# Patient Record
Sex: Male | Born: 1945 | Race: White | Hispanic: No | Marital: Married | Smoking: Former smoker
Health system: Southern US, Community
[De-identification: ages and names within clinical notes are randomized; demographics above are authoritative.]

## PROBLEM LIST (undated history)

## (undated) HISTORY — PX: TONSILLECTOMY: SUR1361

---

## 2013-10-02 ENCOUNTER — Ambulatory Visit: Payer: Self-pay | Admitting: Gastroenterology

## 2013-10-03 LAB — PATHOLOGY REPORT

## 2015-07-26 DIAGNOSIS — R7309 Other abnormal glucose: Secondary | ICD-10-CM | POA: Diagnosis not present

## 2015-07-26 DIAGNOSIS — E7800 Pure hypercholesterolemia, unspecified: Secondary | ICD-10-CM | POA: Diagnosis not present

## 2015-07-26 DIAGNOSIS — Z131 Encounter for screening for diabetes mellitus: Secondary | ICD-10-CM | POA: Diagnosis not present

## 2015-07-26 DIAGNOSIS — Z125 Encounter for screening for malignant neoplasm of prostate: Secondary | ICD-10-CM | POA: Diagnosis not present

## 2015-08-02 DIAGNOSIS — E7800 Pure hypercholesterolemia, unspecified: Secondary | ICD-10-CM | POA: Diagnosis not present

## 2015-08-02 DIAGNOSIS — Z131 Encounter for screening for diabetes mellitus: Secondary | ICD-10-CM | POA: Diagnosis not present

## 2015-08-02 DIAGNOSIS — Z0000 Encounter for general adult medical examination without abnormal findings: Secondary | ICD-10-CM | POA: Diagnosis not present

## 2015-08-02 DIAGNOSIS — M79672 Pain in left foot: Secondary | ICD-10-CM | POA: Diagnosis not present

## 2015-08-30 DIAGNOSIS — Z85820 Personal history of malignant melanoma of skin: Secondary | ICD-10-CM | POA: Diagnosis not present

## 2015-08-30 DIAGNOSIS — L218 Other seborrheic dermatitis: Secondary | ICD-10-CM | POA: Diagnosis not present

## 2015-08-30 DIAGNOSIS — L853 Xerosis cutis: Secondary | ICD-10-CM | POA: Diagnosis not present

## 2015-08-30 DIAGNOSIS — L821 Other seborrheic keratosis: Secondary | ICD-10-CM | POA: Diagnosis not present

## 2016-01-03 DIAGNOSIS — L298 Other pruritus: Secondary | ICD-10-CM | POA: Diagnosis not present

## 2016-01-03 DIAGNOSIS — L218 Other seborrheic dermatitis: Secondary | ICD-10-CM | POA: Diagnosis not present

## 2016-01-03 DIAGNOSIS — L821 Other seborrheic keratosis: Secondary | ICD-10-CM | POA: Diagnosis not present

## 2016-01-03 DIAGNOSIS — Z85820 Personal history of malignant melanoma of skin: Secondary | ICD-10-CM | POA: Diagnosis not present

## 2016-01-03 DIAGNOSIS — L300 Nummular dermatitis: Secondary | ICD-10-CM | POA: Diagnosis not present

## 2016-01-03 DIAGNOSIS — L820 Inflamed seborrheic keratosis: Secondary | ICD-10-CM | POA: Diagnosis not present

## 2016-01-03 DIAGNOSIS — L538 Other specified erythematous conditions: Secondary | ICD-10-CM | POA: Diagnosis not present

## 2016-02-01 DIAGNOSIS — M546 Pain in thoracic spine: Secondary | ICD-10-CM | POA: Diagnosis not present

## 2016-05-08 DIAGNOSIS — Z85820 Personal history of malignant melanoma of skin: Secondary | ICD-10-CM | POA: Diagnosis not present

## 2016-05-08 DIAGNOSIS — D2261 Melanocytic nevi of right upper limb, including shoulder: Secondary | ICD-10-CM | POA: Diagnosis not present

## 2016-05-08 DIAGNOSIS — D225 Melanocytic nevi of trunk: Secondary | ICD-10-CM | POA: Diagnosis not present

## 2016-05-08 DIAGNOSIS — L570 Actinic keratosis: Secondary | ICD-10-CM | POA: Diagnosis not present

## 2016-05-08 DIAGNOSIS — L300 Nummular dermatitis: Secondary | ICD-10-CM | POA: Diagnosis not present

## 2016-05-08 DIAGNOSIS — X32XXXA Exposure to sunlight, initial encounter: Secondary | ICD-10-CM | POA: Diagnosis not present

## 2016-05-31 DIAGNOSIS — M549 Dorsalgia, unspecified: Secondary | ICD-10-CM | POA: Diagnosis not present

## 2016-05-31 DIAGNOSIS — G8929 Other chronic pain: Secondary | ICD-10-CM | POA: Diagnosis not present

## 2016-05-31 DIAGNOSIS — G5702 Lesion of sciatic nerve, left lower limb: Secondary | ICD-10-CM | POA: Diagnosis not present

## 2016-06-13 DIAGNOSIS — M79652 Pain in left thigh: Secondary | ICD-10-CM | POA: Diagnosis not present

## 2016-06-13 DIAGNOSIS — M79662 Pain in left lower leg: Secondary | ICD-10-CM | POA: Diagnosis not present

## 2016-06-13 DIAGNOSIS — M6281 Muscle weakness (generalized): Secondary | ICD-10-CM | POA: Diagnosis not present

## 2016-06-13 DIAGNOSIS — M25552 Pain in left hip: Secondary | ICD-10-CM | POA: Diagnosis not present

## 2016-06-15 DIAGNOSIS — M25552 Pain in left hip: Secondary | ICD-10-CM | POA: Diagnosis not present

## 2016-06-15 DIAGNOSIS — M6281 Muscle weakness (generalized): Secondary | ICD-10-CM | POA: Diagnosis not present

## 2016-06-15 DIAGNOSIS — M79662 Pain in left lower leg: Secondary | ICD-10-CM | POA: Diagnosis not present

## 2016-06-15 DIAGNOSIS — M79652 Pain in left thigh: Secondary | ICD-10-CM | POA: Diagnosis not present

## 2016-06-20 DIAGNOSIS — M79652 Pain in left thigh: Secondary | ICD-10-CM | POA: Diagnosis not present

## 2016-06-20 DIAGNOSIS — M6281 Muscle weakness (generalized): Secondary | ICD-10-CM | POA: Diagnosis not present

## 2016-06-20 DIAGNOSIS — M25552 Pain in left hip: Secondary | ICD-10-CM | POA: Diagnosis not present

## 2016-06-20 DIAGNOSIS — M79662 Pain in left lower leg: Secondary | ICD-10-CM | POA: Diagnosis not present

## 2016-06-22 DIAGNOSIS — M79662 Pain in left lower leg: Secondary | ICD-10-CM | POA: Diagnosis not present

## 2016-06-22 DIAGNOSIS — M25552 Pain in left hip: Secondary | ICD-10-CM | POA: Diagnosis not present

## 2016-06-22 DIAGNOSIS — M6281 Muscle weakness (generalized): Secondary | ICD-10-CM | POA: Diagnosis not present

## 2016-06-22 DIAGNOSIS — M79652 Pain in left thigh: Secondary | ICD-10-CM | POA: Diagnosis not present

## 2016-06-27 DIAGNOSIS — M25552 Pain in left hip: Secondary | ICD-10-CM | POA: Diagnosis not present

## 2016-06-27 DIAGNOSIS — M79652 Pain in left thigh: Secondary | ICD-10-CM | POA: Diagnosis not present

## 2016-06-27 DIAGNOSIS — M6281 Muscle weakness (generalized): Secondary | ICD-10-CM | POA: Diagnosis not present

## 2016-06-27 DIAGNOSIS — M79662 Pain in left lower leg: Secondary | ICD-10-CM | POA: Diagnosis not present

## 2016-06-30 DIAGNOSIS — K047 Periapical abscess without sinus: Secondary | ICD-10-CM | POA: Diagnosis not present

## 2016-09-06 DIAGNOSIS — L218 Other seborrheic dermatitis: Secondary | ICD-10-CM | POA: Diagnosis not present

## 2016-09-06 DIAGNOSIS — X32XXXA Exposure to sunlight, initial encounter: Secondary | ICD-10-CM | POA: Diagnosis not present

## 2016-09-06 DIAGNOSIS — L570 Actinic keratosis: Secondary | ICD-10-CM | POA: Diagnosis not present

## 2016-09-06 DIAGNOSIS — H61031 Chondritis of right external ear: Secondary | ICD-10-CM | POA: Diagnosis not present

## 2016-09-06 DIAGNOSIS — Z85820 Personal history of malignant melanoma of skin: Secondary | ICD-10-CM | POA: Diagnosis not present

## 2016-09-06 DIAGNOSIS — Z08 Encounter for follow-up examination after completed treatment for malignant neoplasm: Secondary | ICD-10-CM | POA: Diagnosis not present

## 2016-10-09 DIAGNOSIS — M5441 Lumbago with sciatica, right side: Secondary | ICD-10-CM | POA: Diagnosis not present

## 2016-10-09 DIAGNOSIS — H1132 Conjunctival hemorrhage, left eye: Secondary | ICD-10-CM | POA: Diagnosis not present

## 2016-10-17 DIAGNOSIS — H902 Conductive hearing loss, unspecified: Secondary | ICD-10-CM | POA: Diagnosis not present

## 2016-10-17 DIAGNOSIS — H6123 Impacted cerumen, bilateral: Secondary | ICD-10-CM | POA: Diagnosis not present

## 2017-01-05 DIAGNOSIS — Z131 Encounter for screening for diabetes mellitus: Secondary | ICD-10-CM | POA: Diagnosis not present

## 2017-01-05 DIAGNOSIS — E781 Pure hyperglyceridemia: Secondary | ICD-10-CM | POA: Diagnosis not present

## 2017-01-11 DIAGNOSIS — Z0000 Encounter for general adult medical examination without abnormal findings: Secondary | ICD-10-CM | POA: Diagnosis not present

## 2017-01-16 DIAGNOSIS — L600 Ingrowing nail: Secondary | ICD-10-CM | POA: Diagnosis not present

## 2017-01-16 DIAGNOSIS — M2012 Hallux valgus (acquired), left foot: Secondary | ICD-10-CM | POA: Diagnosis not present

## 2017-01-16 DIAGNOSIS — L02619 Cutaneous abscess of unspecified foot: Secondary | ICD-10-CM | POA: Diagnosis not present

## 2017-01-16 DIAGNOSIS — L03119 Cellulitis of unspecified part of limb: Secondary | ICD-10-CM | POA: Diagnosis not present

## 2017-01-16 DIAGNOSIS — G5762 Lesion of plantar nerve, left lower limb: Secondary | ICD-10-CM | POA: Diagnosis not present

## 2017-01-30 DIAGNOSIS — H52223 Regular astigmatism, bilateral: Secondary | ICD-10-CM | POA: Diagnosis not present

## 2017-01-30 DIAGNOSIS — H2513 Age-related nuclear cataract, bilateral: Secondary | ICD-10-CM | POA: Diagnosis not present

## 2017-01-30 DIAGNOSIS — H5213 Myopia, bilateral: Secondary | ICD-10-CM | POA: Diagnosis not present

## 2017-03-14 DIAGNOSIS — X32XXXA Exposure to sunlight, initial encounter: Secondary | ICD-10-CM | POA: Diagnosis not present

## 2017-03-14 DIAGNOSIS — Z85820 Personal history of malignant melanoma of skin: Secondary | ICD-10-CM | POA: Diagnosis not present

## 2017-03-14 DIAGNOSIS — L218 Other seborrheic dermatitis: Secondary | ICD-10-CM | POA: Diagnosis not present

## 2017-03-14 DIAGNOSIS — D225 Melanocytic nevi of trunk: Secondary | ICD-10-CM | POA: Diagnosis not present

## 2017-03-14 DIAGNOSIS — D2261 Melanocytic nevi of right upper limb, including shoulder: Secondary | ICD-10-CM | POA: Diagnosis not present

## 2017-03-14 DIAGNOSIS — L570 Actinic keratosis: Secondary | ICD-10-CM | POA: Diagnosis not present

## 2017-10-24 ENCOUNTER — Encounter: Payer: Self-pay | Admitting: Emergency Medicine

## 2017-10-24 ENCOUNTER — Emergency Department
Admission: EM | Admit: 2017-10-24 | Discharge: 2017-10-24 | Disposition: A | Discharge status: Home / Self Care | Payer: Medicare HMO | Attending: Emergency Medicine | Admitting: Emergency Medicine

## 2017-10-24 ENCOUNTER — Other Ambulatory Visit: Payer: Self-pay

## 2017-10-24 ENCOUNTER — Emergency Department: Payer: Medicare HMO

## 2017-10-24 DIAGNOSIS — W1830XA Fall on same level, unspecified, initial encounter: Secondary | ICD-10-CM | POA: Insufficient documentation

## 2017-10-24 DIAGNOSIS — Y9389 Activity, other specified: Secondary | ICD-10-CM | POA: Insufficient documentation

## 2017-10-24 DIAGNOSIS — Y92009 Unspecified place in unspecified non-institutional (private) residence as the place of occurrence of the external cause: Secondary | ICD-10-CM | POA: Insufficient documentation

## 2017-10-24 DIAGNOSIS — Z87891 Personal history of nicotine dependence: Secondary | ICD-10-CM | POA: Insufficient documentation

## 2017-10-24 DIAGNOSIS — Y999 Unspecified external cause status: Secondary | ICD-10-CM | POA: Diagnosis not present

## 2017-10-24 DIAGNOSIS — E860 Dehydration: Secondary | ICD-10-CM | POA: Diagnosis not present

## 2017-10-24 DIAGNOSIS — S0101XA Laceration without foreign body of scalp, initial encounter: Secondary | ICD-10-CM | POA: Diagnosis present

## 2017-10-24 DIAGNOSIS — S0990XA Unspecified injury of head, initial encounter: Secondary | ICD-10-CM

## 2017-10-24 LAB — COMPREHENSIVE METABOLIC PANEL WITH GFR
ALT: 18 U/L (ref 17–63)
AST: 28 U/L (ref 15–41)
Albumin: 4.5 g/dL (ref 3.5–5.0)
Alkaline Phosphatase: 66 U/L (ref 38–126)
Anion gap: 7 (ref 5–15)
BUN: 28 mg/dL — ABNORMAL HIGH (ref 6–20)
CO2: 26 mmol/L (ref 22–32)
Calcium: 9.3 mg/dL (ref 8.9–10.3)
Chloride: 103 mmol/L (ref 101–111)
Creatinine, Ser: 1.28 mg/dL — ABNORMAL HIGH (ref 0.61–1.24)
GFR calc Af Amer: >60 mL/min
GFR calc non Af Amer: 54 mL/min — ABNORMAL LOW
Glucose, Bld: 132 mg/dL — ABNORMAL HIGH (ref 65–99)
Potassium: 5.5 mmol/L — ABNORMAL HIGH (ref 3.5–5.1)
Sodium: 136 mmol/L (ref 135–145)
Total Bilirubin: 1.3 mg/dL — ABNORMAL HIGH (ref 0.3–1.2)
Total Protein: 8.1 g/dL (ref 6.5–8.1)

## 2017-10-24 LAB — URINALYSIS, COMPLETE (UACMP) WITH MICROSCOPIC
Bacteria, UA: NONE SEEN
Bilirubin Urine: NEGATIVE
Glucose, UA: NEGATIVE mg/dL
Ketones, ur: 20 mg/dL — AB
Leukocytes, UA: NEGATIVE
Nitrite: NEGATIVE
Protein, ur: NEGATIVE mg/dL
Specific Gravity, Urine: 1.02 (ref 1.005–1.030)
Squamous Epithelial / HPF: NONE SEEN
WBC, UA: NONE SEEN WBC/hpf (ref 0–5)
pH: 5 (ref 5.0–8.0)

## 2017-10-24 LAB — CBC
HCT: 50.8 % (ref 40.0–52.0)
Hemoglobin: 17 g/dL (ref 13.0–18.0)
MCH: 31 pg (ref 26.0–34.0)
MCHC: 33.5 g/dL (ref 32.0–36.0)
MCV: 92.5 fL (ref 80.0–100.0)
Platelets: 264 10*3/uL (ref 150–440)
RBC: 5.49 MIL/uL (ref 4.40–5.90)
RDW: 13.4 % (ref 11.5–14.5)
WBC: 17.5 10*3/uL — ABNORMAL HIGH (ref 3.8–10.6)

## 2017-10-24 LAB — LIPASE, BLOOD: Lipase: 32 U/L (ref 11–51)

## 2017-10-24 LAB — TROPONIN I: Troponin I: <0.03 ng/mL

## 2017-10-24 MED ORDER — ONDANSETRON HCL 4 MG/2ML IJ SOLN
4.0000 mg | Freq: Once | INTRAMUSCULAR | Status: AC | PRN
  Administered 2017-10-24: 4 mg via INTRAVENOUS
  Filled 2017-10-24: qty 2

## 2017-10-24 MED ORDER — HEPARIN (PORCINE) IN NACL 100-0.45 UNIT/ML-% IJ SOLN: 12.0000 [IU]/kg/h | Freq: Once | INTRAMUSCULAR | Status: DC

## 2017-10-24 MED ORDER — SODIUM CHLORIDE 0.9 % IV BOLUS
1000.0000 mL | Freq: Once | INTRAVENOUS | Status: AC
  Administered 2017-10-24: 1000 mL via INTRAVENOUS

## 2017-10-24 MED ORDER — SODIUM CHLORIDE 0.9 % IV SOLN
Freq: Once | INTRAVENOUS | Status: AC
  Administered 2017-10-24: 15:00:00 via INTRAVENOUS

## 2017-10-24 NOTE — ED Triage Notes (Signed)
Pt here from [facility] with c/o fall this am around 0800, states he was being treated for shingles and the medication caused vomiting and diarrhea which began in the middle of the night, states he got up to go the bathroom this am, fell (unsure if syncopal episode) laceration noted to right eyebrow with dried blood. Pt states dull headache and nausea at this time.

## 2017-10-24 NOTE — ED Provider Notes (Addendum)
[facility] Emergency Department Provider Note       Time seen: ----------------------------------------- 2:35 PM on 10/24/2017 -----------------------------------------   I have reviewed the triage vital signs and the nursing notes.  HISTORY   Chief Complaint Fall; Dehydration; and Laceration    HPI Alan Arroyo is a 72 y.o. male with no significant past medical history who presents to the ED for a fall and possible syncope this morning.  Patient was seen at [facility] and sent here for evaluation.  Patient was diagnosed recently with shingles in the medial aspect near his right eye, was to see his doctor today but fell and so came here.  Patient states medication he is taking is causing vomiting and diarrhea which began in the middle the night.  Patient states he got up to go to the bathroom and fell.  He is unsure if he passed out or not.  A laceration was noted around his right eyebrow.  He does complain of dull headache and nausea at this time.  History reviewed. No pertinent past medical history.  There are no active problems to display for this patient.   Past Surgical History:  Procedure Laterality Date  . TONSILLECTOMY      Allergies Patient has no known allergies.  Social History Social History   Tobacco Use  . Smoking status: Former Research scientist (life sciences)  . Smokeless tobacco: Never Used  Substance Use Topics  . Alcohol use: Yes    Comment: occas.   . Drug use: Not on file   Review of Systems Constitutional: Negative for fever. ENT: Positive for right periorbital laceration Cardiovascular: Negative for chest pain. Respiratory: Negative for shortness of breath. Gastrointestinal: Negative for abdominal pain, positive for nausea Musculoskeletal: Negative for back pain. Skin: Negative for rash. Neurological: Positive for headache  All systems negative/normal/unremarkable except as stated in the  HPI  ____________________________________________   PHYSICAL EXAM:  VITAL SIGNS: ED Triage Vitals  Enc Vitals Group     BP 10/24/17 1141 94/62     Pulse Rate 10/24/17 1141 (!) 59     Resp 10/24/17 1141 18     Temp 10/24/17 1141 97.8 F (36.6 C)     Temp Source 10/24/17 1141 Oral     SpO2 10/24/17 1141 94 %     Weight 10/24/17 1142 170 lb (77.1 kg)     Height 10/24/17 1142 6\' 1"  (1.854 m)     Head Circumference --      Peak Flow --      Pain Score 10/24/17 1142 1     Pain Loc --      Pain Edu? --      Excl. in [facility]? --    Constitutional: Alert and oriented. Well appearing and in no distress. Eyes: Conjunctivae are normal. Normal extraocular movements. ENT   Head: Normocephalic, right sided stellate laceration through the lateral aspect of the right eyebrow, 3 cm   Nose: No congestion/rhinnorhea.   Mouth/Throat: Mucous membranes are moist.   Neck: No stridor. Cardiovascular: Normal rate, regular rhythm. No murmurs, rubs, or gallops. Respiratory: Normal respiratory effort without tachypnea nor retractions. Breath sounds are clear and equal bilaterally. No wheezes/rales/rhonchi. Gastrointestinal: Soft and nontender. Normal bowel sounds Musculoskeletal: Nontender with normal range of motion in extremities. No lower extremity tenderness nor edema. Neurologic:  Normal speech and language. No gross focal neurologic deficits are appreciated.  Skin: Right eyebrow laceration, there are vesicular looking lesions near the corner of the right eye medially Psychiatric:  Mood and affect are normal. Speech and behavior are normal.  ____________________________________________  EKG: Interpreted by me.  Sinus rhythm with a rate of 65 bpm, normal PR interval, normal QRS size, normal QT  ____________________________________________  ED COURSE:  As part of my medical decision making, I reviewed the following data within the [place] History obtained from family  if available, nursing notes, old chart and ekg, as well as notes from prior ED visits. Patient presented for fall, head injury and possible syncope secondary to vomiting and diarrhea, we will assess with labs and imaging as indicated at this time.  Patient will also receive IV fluids and will need a laceration repair Clinical Course as of Oct 24 1508  Wed Oct 24, 2017  1508 CT Head Wo Contrast [JW]    Clinical Course User Index [JW] Earleen Newport, MD   ..Laceration Repair Date/Time: 10/24/2017 2:39 PM Performed by: Earleen Newport, MD Authorized by: Earleen Newport, MD   Consent:    Consent obtained:  Verbal   Consent given by:  Patient   Risks discussed:  Infection, pain, retained foreign body, poor cosmetic result and poor wound healing Anesthesia (see MAR for exact dosages):    Anesthesia method:  None Repair type:    Repair type:  Simple Exploration:    Hemostasis achieved with:  Direct pressure   Wound exploration: entire depth of wound probed and visualized     Contaminated: no   Treatment:    Area cleansed with:  Saline   Amount of cleaning:  Extensive   Irrigation solution:  Sterile saline   Visualized foreign bodies/material removed: no   Skin repair:    Repair method:  Tissue adhesive Approximation:    Approximation:  Close Post-procedure details:    Dressing:  Sterile dressing   Patient tolerance of procedure:  Tolerated well, no immediate complications  ___________________________________________   LABS (pertinent positives/negatives)  Labs Reviewed  COMPREHENSIVE METABOLIC PANEL - Abnormal; Notable for the following components:      Result Value   Potassium 5.5 (*)    Glucose, Bld 132 (*)    BUN 28 (*)    Creatinine, Ser 1.28 (*)    Total Bilirubin 1.3 (*)    GFR calc non Af Amer 54 (*)    All other components within normal limits  CBC - Abnormal; Notable for the following components:   WBC 17.5 (*)    All other components within  normal limits  LIPASE, BLOOD  URINALYSIS, COMPLETE (UACMP) WITH MICROSCOPIC  TROPONIN I    RADIOLOGY Viewed by me CT head IMPRESSION: LEFT frontal meningioma, 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 Neurosurgical consultation is warranted.  No intracranial hemorrhage or skull fracture associated with recent fall. ____________________________________________  DIFFERENTIAL DIAGNOSIS   Dehydration, electrolyte abnormality, subdural hematoma, concussion, arrhythmia, MI  FINAL ASSESSMENT AND PLAN  Syncope, vomiting and diarrhea, head injury, scalp laceration   Plan: The patient had presented for a fall with possible syncope and head injury leading to subsequent laceration which was repaired as dictated above. Patient's labs did reveal some dehydration with an elevated BUN and creatinine.  He was given 2 L of fluid here.  Unexpectedly we discovered a left frontal meningioma with some midline shift. Patient is pending an evaluation by neurosurgery here.   Laurence Aly, MD   Note: This note was generated in part or whole with voice  recognition software. Voice recognition is usually quite accurate but there are transcription errors that can and very often do occur. I apologize for any typographical errors that were not detected and corrected.     Earleen Newport, MD 10/24/17 1511    Earleen Newport, MD 10/24/17 [phone]

## 2017-10-24 NOTE — ED Triage Notes (Signed)
Possible shingles to right eye, was to see eye dr today but fell-came here.

## 2017-10-24 NOTE — ED Provider Notes (Signed)
Note from Dr. Jimmye Norman in the 72 year old male who had a syncopal episode earlier today.  Plan is to follow-up with Dr. Izora Ribas of neurosurgery after he consults.  Physical Exam  BP 129/65   Pulse (!) 55   Temp 97.8 F (36.6 C) (Oral)   Resp 16   Ht 6\' 1"  (1.854 m)   Wt 77.1 kg (170 lb)   SpO2 97%   BMI 22.43 kg/m  ----------------------------------------- 5:06 PM on 10/24/2017 -----------------------------------------   Physical Exam Patient at this time back to baseline.  Denies any weakness.  Was able to ambulate without difficulty. ED Course/Procedures   Clinical Course as of Oct 24 1704  Wed Oct 24, 2017  1508 CT Head Wo Contrast [JW]    Clinical Course User Index [JW] Earleen Newport, MD    Procedures  MDM  Dr. Cari Caraway says that the patient is cleared for outpatient follow-up and is working with his office to set up the patient for an appointment.  Patient is understanding of this plan willing to comply.  Will be discharged at this time.  Likely syncope secondary to dehydration as the patient had multiple episodes of diarrhea prior to the occurrence of the events today.  The patient and family are understanding of the treatment plan and willing to comply.     Orbie Pyo, MD 10/24/17 [phone]

## 2017-10-24 NOTE — Consult Note (Signed)
Referring Physician:  No referring provider defined for this encounter.  Primary Physician:  Derinda Late, MD  Chief Complaint:  Fall, dizziness, diarrhea, new brain mass  History of Present Illness: 10/24/2017 Alan Arroyo is a 72 y.o. male who presents with the chief complaint of diarrhea, nausea, and feeling faint.  He has had some R eye injection and itching for 10 days or so.  He started antibiotics yesterday.  He began having diarrhea and vomiting last night, then suffered a fall going to the restroom this morning at approximately.  He has not eaten today.  He presented to the ER after suffering a laceration.    He denies HA, N, V, signs or symptoms of seizure.  He does have some weakness in the L leg that has been ongoing for many years.  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  He was referred to a neurosurgeon in [place], but has not seen him yet.  Review of Systems:  A 10 point review of systems is negative, except for the pertinent positives and negatives detailed in the HPI.  Past Medical History: History reviewed. No pertinent past medical history.  Past Surgical History: Past Surgical History:  Procedure Laterality Date  . TONSILLECTOMY      Allergies: Allergies as of 10/24/2017  . (No Known Allergies)    Medications: No current facility-administered medications for this encounter.  No current outpatient medications on file.   Social History: Social History   Tobacco Use  . Smoking status: Former Research scientist (life sciences)  . Smokeless tobacco: Never Used  Substance Use Topics  . Alcohol use: Yes    Comment: occas.   . Drug use: Not on file    Family Medical History: No family history on file.  Physical Examination: Vitals:   10/24/17 1515 10/24/17 1530  BP:  129/65  Pulse: (!) 57 (!) 55  Resp:  16  Temp:    SpO2: 98% 97%     General: Patient is well developed, well nourished, calm, collected, and in no apparent  distress.  Psychiatric: Patient is non-anxious.  Head:  Pupils equal, round, and reactive to light.  ENT:  Oral mucosa appears well hydrated.  Neck:   Supple.  Full range of motion.  Respiratory: Patient is breathing without any difficulty.  Extremities: No edema.  Vascular: Palpable pulses in dorsal pedal vessels.  Skin:   On exposed skin, there are no abnormal skin lesions.  NEUROLOGICAL:  General: In no acute distress.   Awake, alert, oriented to person, place, and time.  Pupils equal round and reactive to light.  Facial tone is symmetric.  Tongue protrusion is midline.  There is no pronator drift.      Strength: Side Biceps Triceps Deltoid Interossei Grip Wrist Ext. Wrist Flex.  R 5 5 5 5 5 5 5   L 5 5 5 5 5 5 5    Side Iliopsoas Quads Hamstring PF DF EHL  R 5 5 5 5 5 5   L 5 5 5 5  4+ 4+   Reflexes are 2+ and symmetric at the biceps, triceps, brachioradialis, patella and achilles.   Bilateral upper and lower extremity sensation is intact to light touch and pin prick.  Clonus is not present.  Toes are down-going.  Gait is normal.  No difficulty with tandem gait.  Hoffman's is absent.  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 Neurosurgical consultation is warranted.  No intracranial hemorrhage or skull fracture associated with recent fall.   Electronically Signed   By: Staci Righter M.D.   On: 10/24/2017 15:05  I have personally reviewed the images and agree with the above interpretation.  Assessment and Plan: [name] is a pleasant 72 y.o. male with L Frontal mass most consistent with meningioma.  This appears to be an incidental finding, though it may be related to his fall. He currently has no symptoms.  I will obtain outpatient MRI and see him as an outpatient.  I discussed this with the patient and his wife, and with  the Dr. Clearnce Hasten, who is taking care of him in the emergency department.  Because he is not symptomatic, I would not recommend steroids. He has not had any evidence of seizure activity, so no prophylactic seizure meds.    Gannon Heinzman K. Izora Ribas MD, [place] [facility]

## 2017-11-02 ENCOUNTER — Other Ambulatory Visit: Payer: Self-pay | Admitting: Neurosurgery

## 2017-11-02 DIAGNOSIS — D432 Neoplasm of uncertain behavior of brain, unspecified: Secondary | ICD-10-CM

## 2017-11-02 DIAGNOSIS — D329 Benign neoplasm of meninges, unspecified: Secondary | ICD-10-CM

## 2017-11-02 DIAGNOSIS — D434 Neoplasm of uncertain behavior of spinal cord: Secondary | ICD-10-CM

## 2017-11-09 ENCOUNTER — Ambulatory Visit
Admission: RE | Admit: 2017-11-09 | Discharge: 2017-11-09 | Disposition: A | Discharge status: Home / Self Care | Payer: Medicare HMO | Source: Ambulatory Visit | Attending: Neurosurgery | Admitting: Neurosurgery

## 2017-11-09 DIAGNOSIS — D434 Neoplasm of uncertain behavior of spinal cord: Principal | ICD-10-CM

## 2017-11-09 DIAGNOSIS — M4802 Spinal stenosis, cervical region: Secondary | ICD-10-CM | POA: Diagnosis not present

## 2017-11-09 DIAGNOSIS — D320 Benign neoplasm of cerebral meninges: Secondary | ICD-10-CM | POA: Diagnosis not present

## 2017-11-09 DIAGNOSIS — D432 Neoplasm of uncertain behavior of brain, unspecified: Secondary | ICD-10-CM | POA: Diagnosis not present

## 2017-11-09 DIAGNOSIS — R9082 White matter disease, unspecified: Secondary | ICD-10-CM | POA: Diagnosis not present

## 2017-11-09 DIAGNOSIS — D329 Benign neoplasm of meninges, unspecified: Secondary | ICD-10-CM

## 2017-11-09 DIAGNOSIS — M8938 Hypertrophy of bone, other site: Secondary | ICD-10-CM | POA: Diagnosis not present

## 2017-11-09 DIAGNOSIS — Z8673 Personal history of transient ischemic attack (TIA), and cerebral infarction without residual deficits: Secondary | ICD-10-CM | POA: Insufficient documentation

## 2017-11-09 DIAGNOSIS — M4804 Spinal stenosis, thoracic region: Secondary | ICD-10-CM | POA: Diagnosis not present

## 2017-11-09 DIAGNOSIS — G588 Other specified mononeuropathies: Secondary | ICD-10-CM | POA: Diagnosis not present

## 2017-11-09 MED ORDER — GADOBENATE DIMEGLUMINE 529 MG/ML IV SOLN
15.0000 mL | Freq: Once | INTRAVENOUS | Status: AC | PRN
  Administered 2017-11-09: 15 mL via INTRAVENOUS

## 2017-11-27 ENCOUNTER — Other Ambulatory Visit: Payer: Self-pay | Admitting: Neurosurgery

## 2017-11-27 DIAGNOSIS — D3610 Benign neoplasm of peripheral nerves and autonomic nervous system, unspecified: Secondary | ICD-10-CM

## 2017-12-12 ENCOUNTER — Ambulatory Visit: Payer: Medicare HMO

## 2018-03-28 ENCOUNTER — Other Ambulatory Visit: Payer: Self-pay | Admitting: Neurosurgery

## 2018-03-28 DIAGNOSIS — D329 Benign neoplasm of meninges, unspecified: Secondary | ICD-10-CM

## 2018-04-15 ENCOUNTER — Other Ambulatory Visit: Payer: Self-pay | Admitting: Neurosurgery

## 2018-04-15 DIAGNOSIS — G8929 Other chronic pain: Secondary | ICD-10-CM

## 2018-04-15 DIAGNOSIS — M25562 Pain in left knee: Secondary | ICD-10-CM

## 2018-04-15 DIAGNOSIS — D329 Benign neoplasm of meninges, unspecified: Secondary | ICD-10-CM

## 2018-04-25 ENCOUNTER — Ambulatory Visit: Payer: Medicare HMO

## 2018-06-11 ENCOUNTER — Ambulatory Visit
Admission: RE | Admit: 2018-06-11 | Discharge: 2018-06-11 | Disposition: A | Discharge status: Home / Self Care | Payer: Medicare HMO | Source: Ambulatory Visit | Attending: Neurosurgery | Admitting: Neurosurgery

## 2018-06-11 DIAGNOSIS — M25562 Pain in left knee: Secondary | ICD-10-CM | POA: Insufficient documentation

## 2018-06-11 DIAGNOSIS — D329 Benign neoplasm of meninges, unspecified: Secondary | ICD-10-CM

## 2018-06-11 DIAGNOSIS — G8929 Other chronic pain: Secondary | ICD-10-CM | POA: Insufficient documentation

## 2018-06-11 DIAGNOSIS — D320 Benign neoplasm of cerebral meninges: Secondary | ICD-10-CM | POA: Diagnosis not present

## 2018-06-11 MED ORDER — GADOBUTROL 1 MMOL/ML IV SOLN
8.0000 mL | Freq: Once | INTRAVENOUS | Status: AC | PRN
  Administered 2018-06-11: 8 mL via INTRAVENOUS

## 2018-07-26 ENCOUNTER — Other Ambulatory Visit (HOSPITAL_COMMUNITY): Payer: Self-pay | Admitting: Orthopedic Surgery

## 2018-07-26 ENCOUNTER — Other Ambulatory Visit: Payer: Self-pay | Admitting: Orthopedic Surgery

## 2018-07-26 DIAGNOSIS — D4819 Other specified neoplasm of uncertain behavior of connective and other soft tissue: Secondary | ICD-10-CM

## 2018-07-26 DIAGNOSIS — D481 Neoplasm of uncertain behavior of connective and other soft tissue: Secondary | ICD-10-CM

## 2018-08-09 ENCOUNTER — Ambulatory Visit: Admission: RE | Admit: 2018-08-09 | Payer: Medicare HMO | Source: Ambulatory Visit

## 2018-08-09 ENCOUNTER — Ambulatory Visit: Payer: Medicare HMO

## 2018-08-21 ENCOUNTER — Ambulatory Visit
Admission: RE | Admit: 2018-08-21 | Discharge: 2018-08-21 | Disposition: A | Discharge status: Home / Self Care | Payer: Medicare HMO | Source: Ambulatory Visit | Attending: Orthopedic Surgery | Admitting: Orthopedic Surgery

## 2018-08-21 DIAGNOSIS — D481 Neoplasm of uncertain behavior of connective and other soft tissue: Secondary | ICD-10-CM | POA: Diagnosis present

## 2018-08-21 DIAGNOSIS — D4819 Other specified neoplasm of uncertain behavior of connective and other soft tissue: Secondary | ICD-10-CM

## 2018-08-21 MED ORDER — GADOBUTROL 1 MMOL/ML IV SOLN
7.5000 mL | Freq: Once | INTRAVENOUS | Status: AC | PRN
  Administered 2018-08-21: 7.5 mL via INTRAVENOUS

## 2019-05-22 ENCOUNTER — Other Ambulatory Visit: Payer: Self-pay | Admitting: Neurosurgery

## 2019-05-22 DIAGNOSIS — D3610 Benign neoplasm of peripheral nerves and autonomic nervous system, unspecified: Secondary | ICD-10-CM

## 2019-05-26 ENCOUNTER — Other Ambulatory Visit: Payer: Self-pay

## 2019-05-26 DIAGNOSIS — Z20822 Contact with and (suspected) exposure to covid-19: Secondary | ICD-10-CM

## 2019-05-28 LAB — NOVEL CORONAVIRUS, NAA: SARS-CoV-2, NAA: NOT DETECTED

## 2019-06-17 ENCOUNTER — Other Ambulatory Visit: Payer: Self-pay

## 2019-06-17 ENCOUNTER — Ambulatory Visit
Admission: RE | Admit: 2019-06-17 | Discharge: 2019-06-17 | Disposition: A | Discharge status: Home / Self Care | Payer: Medicare HMO | Source: Ambulatory Visit | Attending: Neurosurgery | Admitting: Neurosurgery

## 2019-06-17 DIAGNOSIS — D3610 Benign neoplasm of peripheral nerves and autonomic nervous system, unspecified: Secondary | ICD-10-CM | POA: Insufficient documentation

## 2019-06-17 MED ORDER — GADOBUTROL 1 MMOL/ML IV SOLN
7.0000 mL | Freq: Once | INTRAVENOUS | Status: AC | PRN
  Administered 2019-06-17: 7 mL via INTRAVENOUS

## 2020-11-22 ENCOUNTER — Other Ambulatory Visit: Payer: Self-pay | Admitting: Neurosurgery

## 2020-11-22 ENCOUNTER — Other Ambulatory Visit (HOSPITAL_COMMUNITY): Payer: Self-pay | Admitting: Neurosurgery

## 2020-11-22 DIAGNOSIS — D329 Benign neoplasm of meninges, unspecified: Secondary | ICD-10-CM

## 2020-12-15 ENCOUNTER — Ambulatory Visit
Admission: RE | Admit: 2020-12-15 | Discharge: 2020-12-15 | Disposition: A | Discharge status: Home / Self Care | Payer: Medicare HMO | Source: Ambulatory Visit | Attending: Neurosurgery | Admitting: Neurosurgery

## 2020-12-15 ENCOUNTER — Other Ambulatory Visit: Payer: Self-pay

## 2020-12-15 DIAGNOSIS — D329 Benign neoplasm of meninges, unspecified: Secondary | ICD-10-CM | POA: Insufficient documentation

## 2020-12-15 MED ORDER — GADOBUTROL 1 MMOL/ML IV SOLN
7.5000 mL | Freq: Once | INTRAVENOUS | Status: AC | PRN
  Administered 2020-12-15: 7.5 mL via INTRAVENOUS

## 2021-08-31 ENCOUNTER — Telehealth: Payer: Self-pay

## 2021-08-31 NOTE — Telephone Encounter (Signed)
Complete

## 2021-08-31 NOTE — Telephone Encounter (Signed)
Copied from [place] [id number]. Topic: General - Other >> Aug 31, 2021  9:33 AM Tessa Lerner A wrote: Reason for CRM: The patient would like to speak with Ms Arbie Cookey when possible  The patient shares that "Dr. Rosanna Randy told me to call Arbie Cookey for something about scheduling" but was uncertain of the specifics   Please contact further when available

## 2021-10-28 IMAGING — MR MR HEAD WO/W CM
14 series · 48 of 48 positions shown · IV contrast (gadavist)
Comparison: MRI of the brain June 17, 2019.

CLINICAL DATA: Meningioma.

EXAM:
MRI HEAD WITHOUT AND WITH CONTRAST
TECHNIQUE: Multiplanar, multiecho pulse sequences of the brain and surrounding
structures were obtained without and with intravenous contrast.
CONTRAST:  7.5mL GADAVIST GADOBUTROL 1 MMOL/ML IV SOLN

[Series 5: ax dwi_tracew · axial · 3.0mm · 0.65mm/px · z∈[-71,+76]mm · 4 of 48 slices shown]
[im 1/48]
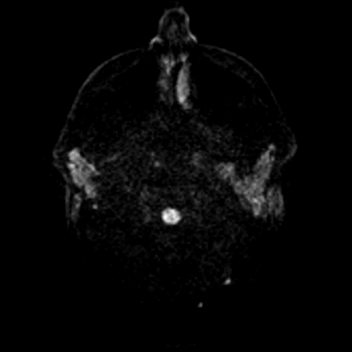
[im 16/48]
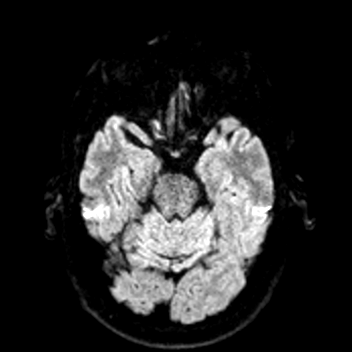
[im 32/48]
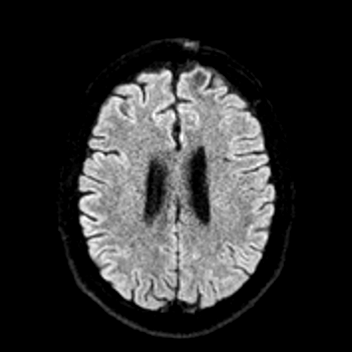
[im 48/48]
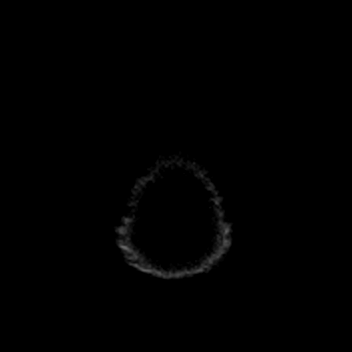

[Series 6: ax dwi_adc · axial · 3.0mm · 0.65mm/px · z∈[-71,+76]mm · 4 of 48 slices shown]
[im 1/48]
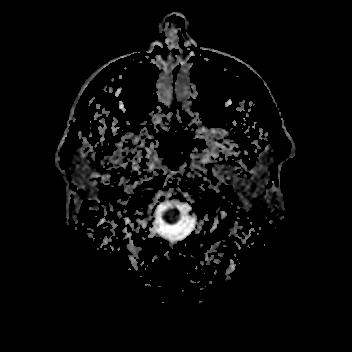
[im 16/48]
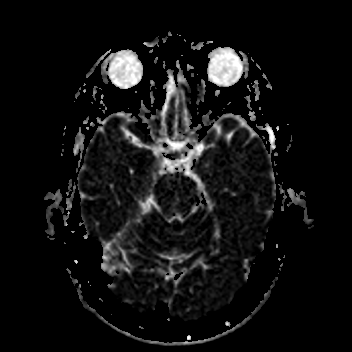
[im 32/48]
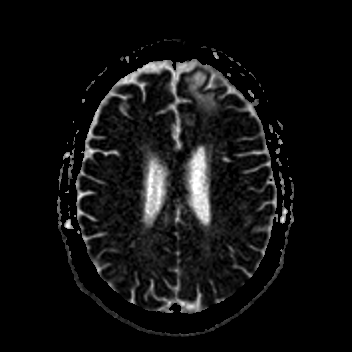
[im 48/48]
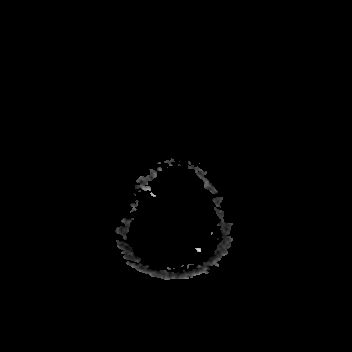

[Series 7: cor dwi_tracew · coronal · 5.0mm · 0.65mm/px · 2 of 40 slices shown]
[im 1/40]
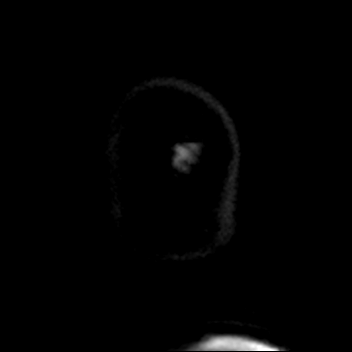
[im 40/40]
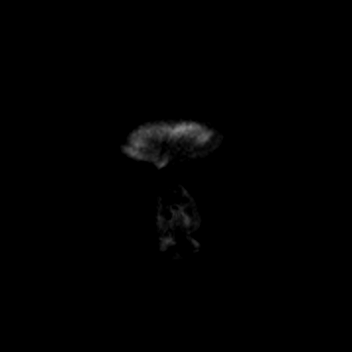

[Series 8: cor dwi_adc · coronal · 5.0mm · 0.65mm/px · 2 of 40 slices shown]
[im 1/40]
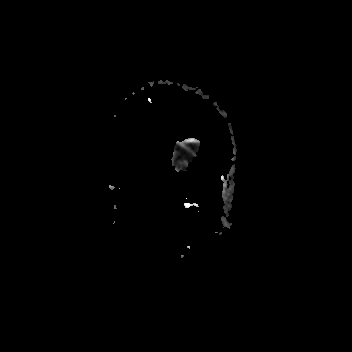
[im 40/40]
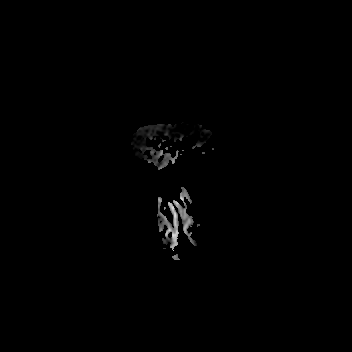

[Series 9: T1 · sagittal · 5.0mm · 0.62mm/px · 1 of 25 slices shown (1 of 2)]
[im 1/25]
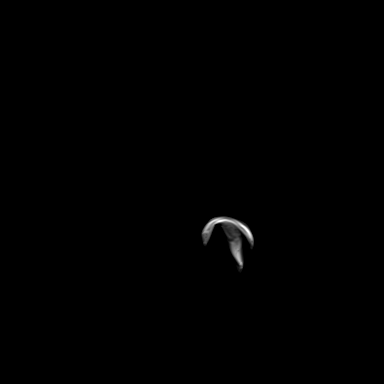

[Series 10: T2 · axial · 5.0mm · 0.53mm/px · 1 of 25 slices shown]
[im 1/25]
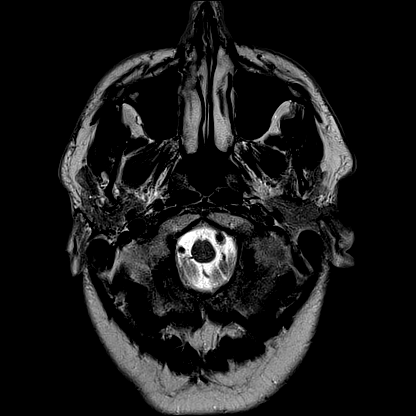

[Series 12: pha_images · axial · 3.0mm · 0.90mm/px · z∈[-81,+87]mm · 3 of 60 slices shown]
[im 1/60]
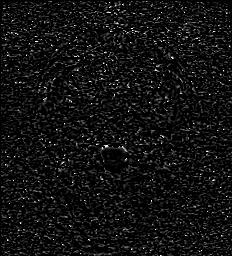
[im 30/60]
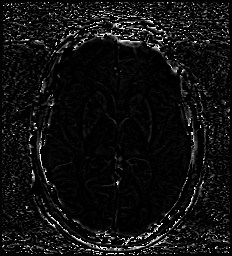
[im 60/60]
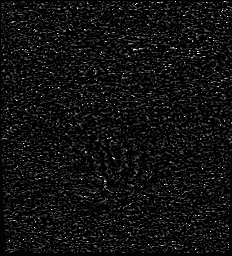

[Series 13: swi_images · axial · 3.0mm · 0.90mm/px · z∈[-81,+87]mm · 3 of 60 slices shown]
[im 1/60]
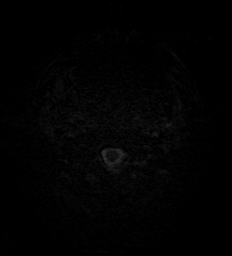
[im 30/60]
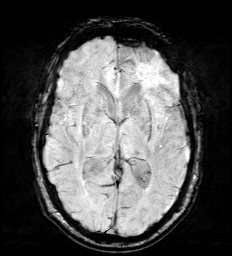
[im 60/60]
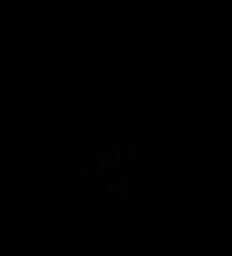

[Series 15: FLAIR · axial · 3.0mm · 0.53mm/px · z∈[-76,+78]mm · 3 of 55 slices shown]
[im 1/55]
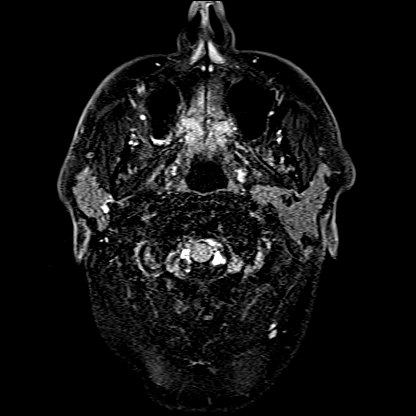
[im 28/55]
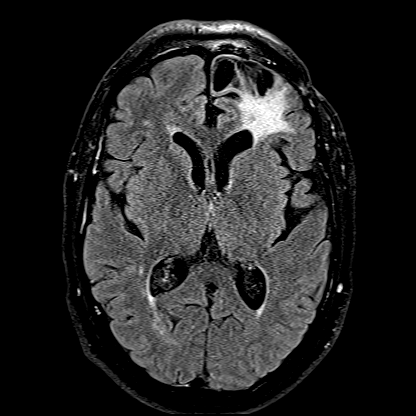
[im 55/55]
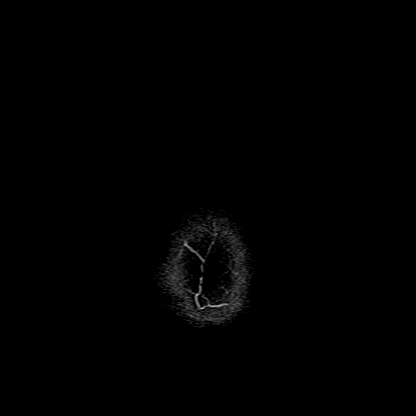

[Series 16: T1 · axial · 1.0mm · 0.98mm/px · z∈[-78,+89]mm · 10 of 176 slices shown (2 of 2)]
[im 1/176]
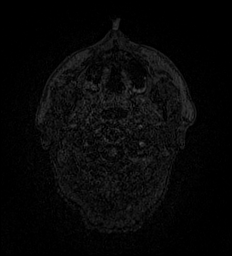
[im 20/176]
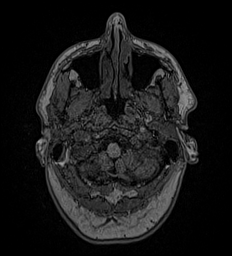
[im 39/176]
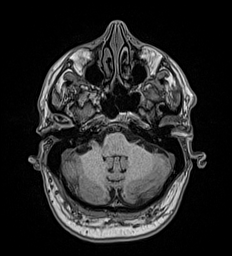
[im 59/176]
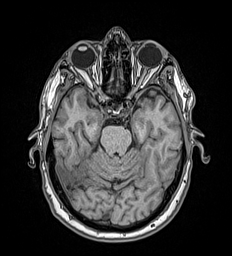
[im 78/176]
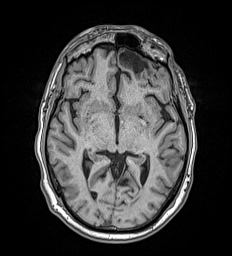
[im 98/176]
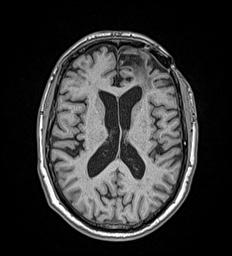
[im 117/176]
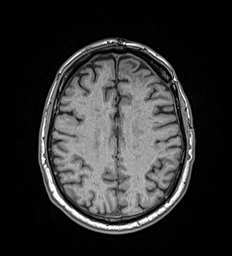
[im 137/176]
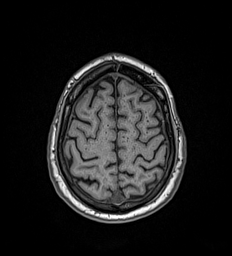
[im 156/176]
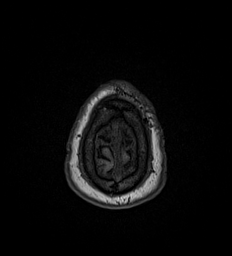
[im 176/176]
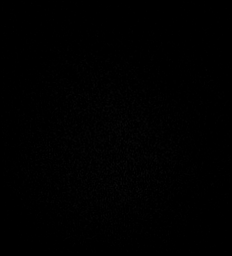

[Series 17: T2 post-contrast · coronal · 5.0mm · 0.57mm/px · 2 of 29 slices shown]
[im 1/29]
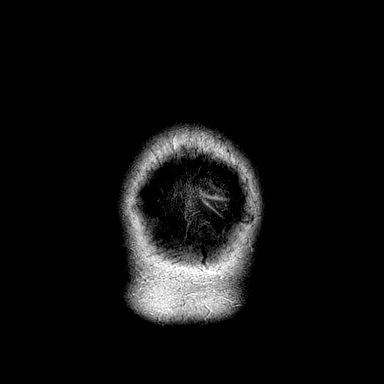
[im 29/29]
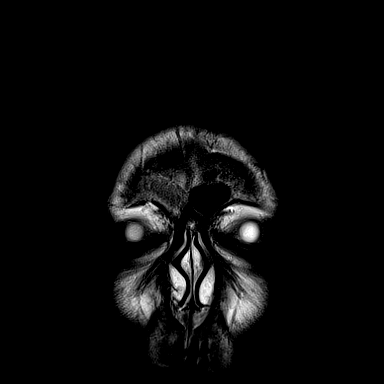

[Series 18: T1 post-contrast · axial · 1.0mm · 0.98mm/px · z∈[-78,+89]mm · 10 of 176 slices shown (1 of 3)]
[im 1/176]
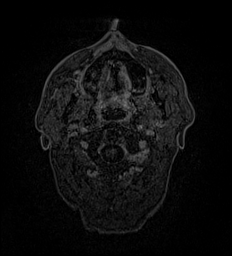
[im 20/176]
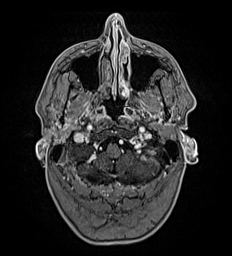
[im 39/176]
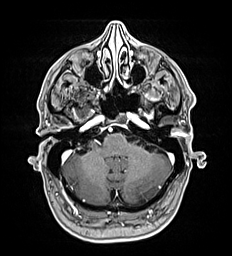
[im 59/176]
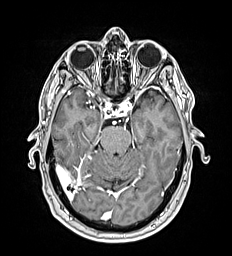
[im 78/176]
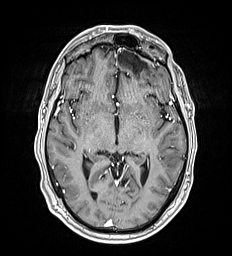
[im 98/176]
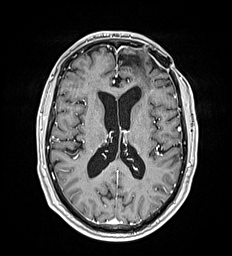
[im 117/176]
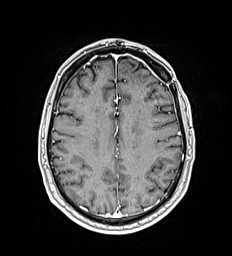
[im 137/176]
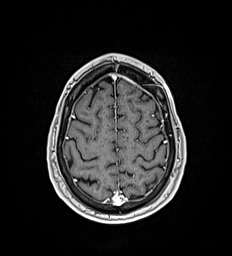
[im 156/176]
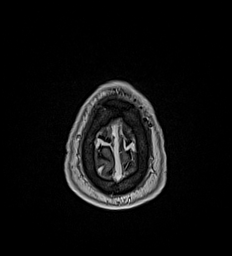
[im 176/176]
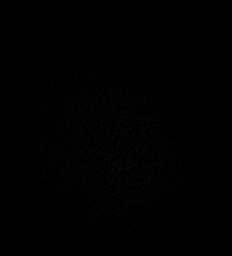

[Series 19: T1 post-contrast · coronal · 5.0mm · 0.57mm/px · 2 of 29 slices shown (2 of 3)]
[im 1/29]
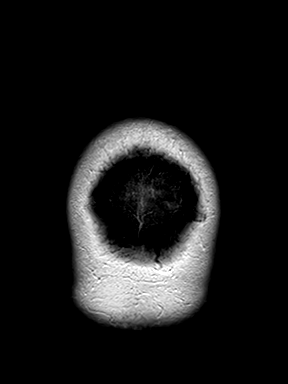
[im 29/29]
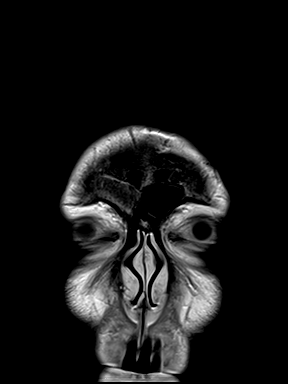

[Series 20: T1 post-contrast · sagittal · 5.0mm · 0.62mm/px · 1 of 25 slices shown (3 of 3)]
[im 1/25]
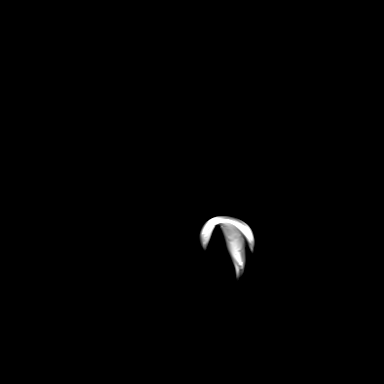

[48 of 48 positions shown; findings below may reference images not displayed]

FINDINGS: Brain: Postsurgical changes with area of encephalomalacia and
gliosis in the left frontal lobe, similar to prior. There is
increased thickness and contrast enhancement of the dura subjacent
to the craniotomy as well as along the left greater sphenoid wing,
similar to prior. No nodular focus of contrast enhancement to
suggest residual/recurrent tumor.

No acute infarction, hemorrhage, hydrocephalus, or extra-axial
collection.

Remote lacunar infarcts in the right cerebellar hemisphere and right
corona radiata. Scattered foci of T2 hyperintensity are seen within
the white matter of the cerebral hemispheres, nonspecific, most
likely related to chronic small vessel ischemia.

Vascular: Normal flow voids.

Skull and upper cervical spine: Postsurgical changes from left
frontal craniotomy. Marrow signal characteristics are otherwise
maintained.

Sinuses/Orbits: Left lens surgery.  Paranasal sinuses are clear.
IMPRESSION: Postsurgical changes from left frontal meningioma resection with
stable appearance of dural thickening and contrast enhancement
subjacent to the area of craniotomy and along the left greater
sphenoid wing. No focus of nodular contrast enhancement to suggest
residual/recurrent tumor.

## 2023-12-15 ENCOUNTER — Other Ambulatory Visit: Payer: Self-pay

## 2023-12-15 ENCOUNTER — Emergency Department
Admission: EM | Admit: 2023-12-15 | Discharge: 2023-12-15 | Disposition: A | Discharge status: Home / Self Care | Attending: Emergency Medicine | Admitting: Emergency Medicine

## 2023-12-15 ENCOUNTER — Emergency Department

## 2023-12-15 DIAGNOSIS — M79661 Pain in right lower leg: Secondary | ICD-10-CM | POA: Diagnosis present

## 2023-12-15 DIAGNOSIS — Z87891 Personal history of nicotine dependence: Secondary | ICD-10-CM | POA: Diagnosis not present

## 2023-12-15 DIAGNOSIS — I82441 Acute embolism and thrombosis of right tibial vein: Secondary | ICD-10-CM | POA: Insufficient documentation

## 2023-12-15 DIAGNOSIS — N189 Chronic kidney disease, unspecified: Secondary | ICD-10-CM | POA: Diagnosis not present

## 2023-12-15 LAB — CBC WITH DIFFERENTIAL/PLATELET
Abs Immature Granulocytes: 0.03 10*3/uL (ref 0.00–0.07)
Basophils Absolute: 0.1 10*3/uL (ref 0.0–0.1)
Basophils Relative: 1 %
Eosinophils Absolute: 0.4 10*3/uL (ref 0.0–0.5)
Eosinophils Relative: 5 %
HCT: 46.6 % (ref 39.0–52.0)
Hemoglobin: 15.3 g/dL (ref 13.0–17.0)
Immature Granulocytes: 0 %
Lymphocytes Relative: 24 %
Lymphs Abs: 2.1 10*3/uL (ref 0.7–4.0)
MCH: 30.7 pg (ref 26.0–34.0)
MCHC: 32.8 g/dL (ref 30.0–36.0)
MCV: 93.4 fL (ref 80.0–100.0)
Monocytes Absolute: 1 10*3/uL (ref 0.1–1.0)
Monocytes Relative: 12 %
Neutro Abs: 5.1 10*3/uL (ref 1.7–7.7)
Neutrophils Relative %: 58 %
Platelets: 192 10*3/uL (ref 150–400)
RBC: 4.99 MIL/uL (ref 4.22–5.81)
RDW: 12.1 % (ref 11.5–15.5)
WBC: 8.8 10*3/uL (ref 4.0–10.5)
nRBC: 0 % (ref 0.0–0.2)

## 2023-12-15 LAB — BASIC METABOLIC PANEL WITH GFR
Anion gap: 5 (ref 5–15)
BUN: 21 mg/dL (ref 8–23)
CO2: 28 mmol/L (ref 22–32)
Calcium: 8.7 mg/dL — ABNORMAL LOW (ref 8.9–10.3)
Chloride: 104 mmol/L (ref 98–111)
Creatinine, Ser: 1.18 mg/dL (ref 0.61–1.24)
GFR, Estimated: >60 mL/min
Glucose, Bld: 99 mg/dL (ref 70–99)
Potassium: 4.4 mmol/L (ref 3.5–5.1)
Sodium: 137 mmol/L (ref 135–145)

## 2023-12-15 MED ORDER — APIXABAN (ELIQUIS) VTE STARTER PACK (10MG AND 5MG): ORAL_TABLET | ORAL | 0 refills | Status: DC

## 2023-12-15 NOTE — Discharge Instructions (Addendum)
 Call Monday and make appointment with your primary care provider.  You will need a follow-up appointment.  A prescription for Eliquis was sent to the pharmacy for you to begin taking.  Return to the emergency department if you develop any worsening of your symptoms and especially if you develop any chest pain, shortness of breath or difficulty breathing.  You may take Tylenol with this medication if any pain medications as needed.

## 2023-12-15 NOTE — ED Notes (Signed)
 Pt states that they have pain in the left calf in the middle on the posterior side. Pt denies and CP, SOB, long car rides. Pt is A&Ox4.

## 2023-12-15 NOTE — ED Triage Notes (Addendum)
 First nurse note: Pt to ED via right calf pain that worsened over past week. Pt states pain started on Monday. Increased swelling and tenderness. No SOB or CP

## 2023-12-15 NOTE — ED Provider Notes (Signed)
 [facility] Provider Note    Event Date/Time   First MD Initiated Contact with Patient 12/15/23 1109     (approximate)   History   No chief complaint on file.   HPI  Alan Arroyo is a 78 y.o. male   presents to the ED with complaint of right calf pain that started approximately 5 days ago and is increased in pain in the last 24 hours.  Patient denies any injury to his leg.  He denies prior DVTs, no traveling, non-smoker at present but has been a former smoker in the past.  Patient does have an elevated PSA history and chronic kidney disease.      Physical Exam   Triage Vital Signs: ED Triage Vitals  Encounter Vitals Group     BP 12/15/23 1050 127/88     Systolic BP Percentile --      Diastolic BP Percentile --      Pulse Rate 12/15/23 1050 (!) 50     Resp 12/15/23 1050 20     Temp 12/15/23 1045 97.7 F (36.5 C)     Temp Source 12/15/23 1045 Oral     SpO2 12/15/23 1050 99 %     Weight --      Height --      Head Circumference --      Peak Flow --      Pain Score 12/15/23 1045 5     Pain Loc --      Pain Education --      Exclude from Growth Chart --     Most recent vital signs: Vitals:   12/15/23 1045 12/15/23 1050  BP:  127/88  Pulse:  (!) 50  Resp:  20  Temp: 97.7 F (36.5 C)   SpO2:  99%     General: Awake, no distress.  Talkative, cooperative. CV:  Good peripheral perfusion.  Heart rate rate and rhythm. Resp:  Normal effort.  Lungs clear bilaterally. Abd:  No distention.  Other:  Examination of the right lower extremity there is no gross deformity and no pitting edema.  There is tenderness on palpation of the calf area without discoloration.  There is a slight positive Homans' sign and pain is slightly increased with walking.  Skin is intact.  DP and PT are present and confirmed by Doppler.   ED Results / Procedures / Treatments   Labs (all labs ordered are listed, but only abnormal results are displayed) Labs Reviewed   BASIC METABOLIC PANEL WITH GFR - Abnormal; Notable for the following components:      Result Value   Calcium 8.7 (*)    All other components within normal limits  CBC WITH DIFFERENTIAL/PLATELET      RADIOLOGY Venous ultrasound per radiology is positive for a DVT involving the right popliteal, posterior tibial and peroneal veins.    PROCEDURES:  Critical Care performed:   Procedures   MEDICATIONS ORDERED IN ED: Medications - No data to display   IMPRESSION / MDM / ASSESSMENT AND PLAN / ED COURSE  I reviewed the triage vital signs and the nursing notes.   Differential diagnosis includes, but is not limited to, DVT, muscle strain, musculoskeletal pain, cellulitis considered, insect bite.  78 year old male presents to the ED with complaint of right lower extremity pain that began at the beginning of the week and is worsened over the last 24 hours.  Patient has low risk factors for DVT.  Venous ultrasound did show that  he does have DVTs as noted in the radiology report.  I spoke with him at length about anticoagulants.  He has no symptoms to suggest PE but was given strict return precautions should he develop any chest pain or shortness of breath.  Patient will start on Eliquis which was sent to his pharmacy when he picks it up.  He is strongly encouraged to call Monday and make a follow-up appointment with his PCP and continued management.      Patient's presentation is most consistent with acute illness / injury with system symptoms.  FINAL CLINICAL IMPRESSION(S) / ED DIAGNOSES   Final diagnoses:  Acute deep vein thrombosis (DVT) of tibial vein of right lower extremity (HCC)     Rx / DC Orders   ED Discharge Orders          Ordered    APIXABAN (ELIQUIS) VTE STARTER PACK (10MG  AND 5MG )       Note to Pharmacy: If starter pack unavailable, substitute with seventy-four 5 mg apixaban tabs following the above SIG directions.   12/15/23 1319             Note:   This document was prepared using Dragon voice recognition software and may include unintentional dictation errors.   Stafford Eagles, PA-C 12/15/23 1515    Bryson Carbine, MD 12/15/23 1520

## 2024-01-01 ENCOUNTER — Encounter: Payer: Self-pay | Admitting: Internal Medicine

## 2024-01-01 ENCOUNTER — Inpatient Hospital Stay

## 2024-01-01 ENCOUNTER — Inpatient Hospital Stay: Attending: Internal Medicine | Admitting: Internal Medicine

## 2024-01-01 VITALS — BP 121/71 | HR 43 | Temp 95.9°F | Resp 16 | Ht 73.0 in | Wt 181.6 lb

## 2024-01-01 DIAGNOSIS — I82441 Acute embolism and thrombosis of right tibial vein: Secondary | ICD-10-CM | POA: Diagnosis present

## 2024-01-01 DIAGNOSIS — Z87891 Personal history of nicotine dependence: Secondary | ICD-10-CM | POA: Diagnosis not present

## 2024-01-01 DIAGNOSIS — Z7901 Long term (current) use of anticoagulants: Secondary | ICD-10-CM | POA: Diagnosis not present

## 2024-01-01 DIAGNOSIS — I824Z1 Acute embolism and thrombosis of unspecified deep veins of right distal lower extremity: Secondary | ICD-10-CM | POA: Diagnosis not present

## 2024-01-01 DIAGNOSIS — R972 Elevated prostate specific antigen [PSA]: Secondary | ICD-10-CM

## 2024-01-01 DIAGNOSIS — I82431 Acute embolism and thrombosis of right popliteal vein: Secondary | ICD-10-CM | POA: Diagnosis not present

## 2024-01-01 LAB — CBC WITH DIFFERENTIAL/PLATELET
Abs Immature Granulocytes: 0.02 10*3/uL (ref 0.00–0.07)
Basophils Absolute: 0.1 10*3/uL (ref 0.0–0.1)
Basophils Relative: 1 %
Eosinophils Absolute: 0 10*3/uL (ref 0.0–0.5)
Eosinophils Relative: 0 %
HCT: 46.9 % (ref 39.0–52.0)
Hemoglobin: 15.4 g/dL (ref 13.0–17.0)
Immature Granulocytes: 0 %
Lymphocytes Relative: 27 %
Lymphs Abs: 2.2 10*3/uL (ref 0.7–4.0)
MCH: 30.7 pg (ref 26.0–34.0)
MCHC: 32.8 g/dL (ref 30.0–36.0)
MCV: 93.4 fL (ref 80.0–100.0)
Monocytes Absolute: 0.9 10*3/uL (ref 0.1–1.0)
Monocytes Relative: 11 %
Neutro Abs: 5.1 10*3/uL (ref 1.7–7.7)
Neutrophils Relative %: 61 %
Platelets: 259 10*3/uL (ref 150–400)
RBC: 5.02 MIL/uL (ref 4.22–5.81)
RDW: 12.2 % (ref 11.5–15.5)
WBC: 8.3 10*3/uL (ref 4.0–10.5)
nRBC: 0 % (ref 0.0–0.2)

## 2024-01-01 LAB — COMPREHENSIVE METABOLIC PANEL WITH GFR
ALT: 16 U/L (ref 0–44)
AST: 20 U/L (ref 15–41)
Albumin: 4.1 g/dL (ref 3.5–5.0)
Alkaline Phosphatase: 65 U/L (ref 38–126)
Anion gap: 8 (ref 5–15)
BUN: 26 mg/dL — ABNORMAL HIGH (ref 8–23)
CO2: 24 mmol/L (ref 22–32)
Calcium: 8.9 mg/dL (ref 8.9–10.3)
Chloride: 105 mmol/L (ref 98–111)
Creatinine, Ser: 1.26 mg/dL — ABNORMAL HIGH (ref 0.61–1.24)
GFR, Estimated: 58 mL/min — ABNORMAL LOW
Glucose, Bld: 92 mg/dL (ref 70–99)
Potassium: 4.4 mmol/L (ref 3.5–5.1)
Sodium: 137 mmol/L (ref 135–145)
Total Bilirubin: 0.8 mg/dL (ref 0.0–1.2)
Total Protein: 7.2 g/dL (ref 6.5–8.1)

## 2024-01-01 LAB — PSA: Prostatic Specific Antigen: 6.35 ng/mL — ABNORMAL HIGH (ref 0.00–4.00)

## 2024-01-01 LAB — D-DIMER, QUANTITATIVE: D-Dimer, Quant: 0.42 ug{FEU}/mL (ref 0.00–0.50)

## 2024-01-01 NOTE — Assessment & Plan Note (Addendum)
 12/15/2023- Positive for deep vein thrombosis involving the right popliteal, posterior tibial and peroneal veins. Currently on Eliquis . Recommend vascular surgery evaluation.   # Patient currently tolerating Eliquis  well-with improvement of his leg cramping.  Agree with anticoagulation-to help prevent further blood clots at this time.  Refilled Eliquis - PCP.   # Patient blood clot is thought to be unprovoked given the absence of any identifiable reasons. 3-6 months anticoagulation recommended-however the duration of the anticoagulation will depend upon further hypercoagulable workup.  Also await vascular evaluation.  Consider repeat ultrasound down the line.  # Recommend graduated Compression stockings 30 [at mid calf]-40 mm [at ankle]. Wear them during daytime.  Take them off at night; and keep the legs propped up in bed.   # I had a long discussion with the patient regarding the risk of bleeding while on anticoagulation.  Patient counseled regarding taking at most care to avoid falls/hitting head. The patient should inform us  of any bleeding episodes; or report to emergency room if any significant bleeding is noticed.  Thank you Dr. Tawni for allowing me to participate in the care of your pleasant patient. Please do not hesitate to contact me with questions or concerns in the interim.   # DISPOSITION: # refer to vascular surgery re: RIGHT LE DVT # Labs today-ordered # follow up in 3-4 weeks-  MD: NO labs- Dr.B  All questions were answered. The patient knows to call the clinic with any problems, questions or concerns.

## 2024-01-01 NOTE — Progress Notes (Signed)
 [facility] CONSULT NOTE  Patient Care Team: Diedra Lame, MD as PCP - General (Family Medicine) Rennie Cindy SAUNDERS, MD as Consulting Physician (Oncology)  CHIEF COMPLAINTS/PURPOSE OF CONSULTATION: DVT og Right LE.   #  Oncology History   No history exists.     HISTORY OF PRESENTING ILLNESS: Patient ambulating-independently. Accompanied by  wife.  Alan Arroyo 78 y.o.  male with no prior history of thrombo-embolism has been referred to us  regarding recent DVT.  Patient was recently evaluated in the hospital /ER for for worsening  leg swelling/cramping. NO dyspnea or chest pain.   Bilateral LE venous dopplers: June 7th, 2025-  Positive for deep vein thrombosis involving the right popliteal, posterior tibial and peroneal veins. CTA chest scan: none Patient started on DOAKs; and then discharged.  Patient is here for further workup/recommendations.  With regards risk factors: Long distance travel- > 8 hours: none, but rove 2-3 hours prior.  Recent surgery GA; Immobilization/trauma: none Previous history of DVT/PE: none Obesity:none Smoking: none Family history: none  Cancer screening: colonoscopy; PSA- slow rise [PCP]  Review of Systems  Constitutional:  Negative for chills, diaphoresis, fever, malaise/fatigue and weight loss.  HENT:  Negative for nosebleeds and sore throat.   Eyes:  Negative for double vision.  Respiratory:  Negative for cough, hemoptysis, sputum production, shortness of breath and wheezing.   Cardiovascular:  Negative for chest pain, palpitations, orthopnea and leg swelling.  Gastrointestinal:  Negative for abdominal pain, blood in stool, constipation, diarrhea, heartburn, melena, nausea and vomiting.  Genitourinary:  Negative for dysuria, frequency and urgency.  Musculoskeletal:  Negative for back pain and joint pain.  Skin: Negative.  Negative for itching and rash.  Neurological:  Negative for dizziness, tingling, focal weakness,  weakness and headaches.  Endo/Heme/Allergies:  Does not bruise/bleed easily.  Psychiatric/Behavioral:  Negative for depression. The patient is not nervous/anxious and does not have insomnia.      MEDICAL HISTORY:  History reviewed. No pertinent past medical history.  SURGICAL HISTORY: Past Surgical History:  Procedure Laterality Date   TONSILLECTOMY      SOCIAL HISTORY: Social History   Socioeconomic History   Marital status: Married    Spouse name: Not on file   Number of children: Not on file   Years of education: Not on file   Highest education level: Not on file  Occupational History   Not on file  Tobacco Use   Smoking status: Former   Smokeless tobacco: Never  Vaping Use   Vaping status: Never Used  Substance and Sexual Activity   Alcohol use: Yes    Comment: occas.    Drug use: Never   Sexual activity: Not on file  Other Topics Concern   Not on file  Social History Narrative   Not on file   Social Drivers of Health   Financial Resource Strain: Patient Declined (05/17/2023)   Received from [facility]   Overall Financial Resource Strain (CARDIA)    Difficulty of Paying Living Expenses: Patient declined  Food Insecurity: No Food Insecurity (01/01/2024)   Hunger Vital Sign    Worried About Running Out of Food in the Last Year: Never true    Ran Out of Food in the Last Year: Never true  Transportation Needs: No Transportation Needs (01/01/2024)   PRAPARE - Administrator, Civil Service (Medical): No    Lack of Transportation (Non-Medical): No  Physical Activity: Not on file  Stress: Not on file  Social Connections: Not on file  Intimate Partner Violence: Not At Risk (01/01/2024)   Humiliation, Afraid, Rape, and Kick questionnaire    Fear of Current or Ex-Partner: No    Emotionally Abused: No    Physically Abused: No    Sexually Abused: No    FAMILY HISTORY: Family History  Problem Relation Age of Onset   Prostate cancer  Brother     ALLERGIES:  is allergic to fish allergy.  MEDICATIONS:  Current Outpatient Medications  Medication Sig Dispense Refill   APIXABAN  (ELIQUIS ) VTE STARTER PACK (10MG  AND 5MG ) Take as directed on package: start with two-5mg  tablets twice daily for 7 days. On day 8, switch to one-5mg  tablet twice daily. (Patient taking differently: Take 5 mg by mouth 2 (two) times daily. Take as directed on package: start with two-5mg  tablets twice daily for 7 days. On day 8, switch to one-5mg  tablet twice daily.) 74 each 0   Multiple Vitamin (MULTI VITAMIN PO) Take 1 tablet by mouth. 3 times a week     No current facility-administered medications for this visit.       PHYSICAL EXAMINATION:  Vitals:   01/01/24 1046  BP: 121/71  Pulse: (!) 43  Resp: 16  Temp: (!) 95.9 F (35.5 C)  SpO2: 98%   Filed Weights   01/01/24 1046  Weight: 181 lb 9.6 oz (82.4 kg)    Physical Exam Vitals and nursing note reviewed.  HENT:     Head: Normocephalic and atraumatic.     Mouth/Throat:     Pharynx: Oropharynx is clear.   Eyes:     Extraocular Movements: Extraocular movements intact.     Pupils: Pupils are equal, round, and reactive to light.    Cardiovascular:     Rate and Rhythm: Normal rate and regular rhythm.  Pulmonary:     Comments: Decreased breath sounds bilaterally.  Abdominal:     Palpations: Abdomen is soft.   Musculoskeletal:        General: Normal range of motion.     Cervical back: Normal range of motion.   Skin:    General: Skin is warm.   Neurological:     General: No focal deficit present.     Mental Status: He is alert and oriented to person, place, and time.   Psychiatric:        Behavior: Behavior normal.        Judgment: Judgment normal.      LABORATORY DATA:  I have reviewed the data as listed Lab Results  Component Value Date   WBC 8.8 12/15/2023   HGB 15.3 12/15/2023   HCT 46.6 12/15/2023   MCV 93.4 12/15/2023   PLT 192 12/15/2023   Recent Labs     12/15/23 1249  NA 137  K 4.4  CL 104  CO2 28  GLUCOSE 99  BUN 21  CREATININE 1.18  CALCIUM 8.7*  GFRNONAA >60    RADIOGRAPHIC STUDIES: I have personally reviewed the radiological images as listed and agreed with the findings in the report. US  Venous Img Lower Unilateral Right Result Date: 12/15/2023 CLINICAL DATA:  Right leg pain. EXAM: Right LOWER EXTREMITY VENOUS DOPPLER ULTRASOUND TECHNIQUE: Gray-scale sonography with graded compression, as well as color Doppler and duplex ultrasound were performed to evaluate the lower extremity deep venous systems from the level of the common femoral vein and including the common femoral, femoral, profunda femoral, popliteal and calf veins including the posterior tibial, peroneal and gastrocnemius veins when visible. The  superficial great saphenous vein was also interrogated. Spectral Doppler was utilized to evaluate flow at rest and with distal augmentation maneuvers in the common femoral, femoral and popliteal veins. COMPARISON:  None Available. FINDINGS: Contralateral Common Femoral Vein: Respiratory phasicity is normal and symmetric with the symptomatic side. No evidence of thrombus. Normal compressibility. Common Femoral Vein: No evidence of thrombus. Normal compressibility, respiratory phasicity and response to augmentation. Saphenofemoral Junction: No evidence of thrombus. Normal compressibility and flow on color Doppler imaging. Profunda Femoral Vein: No evidence of thrombus. Normal compressibility and flow on color Doppler imaging. Femoral Vein: No evidence of thrombus. Normal compressibility, respiratory phasicity and response to augmentation. Popliteal Vein: Nonocclusive thrombus identified within the popliteal. Calf Veins: Positive for of occlusive thrombi involving the posterior tibial vein and peroneal vein. Superficial Great Saphenous Vein: No evidence of thrombus. Normal compressibility. Venous Reflux:  None. Other Findings:  None. IMPRESSION:  Positive for deep vein thrombosis involving the right popliteal, posterior tibial and peroneal veins. Critical Value/emergent results were called by telephone at the time of interpretation on 12/15/2023 at 12:51 pm to provider LAMAR PRICE , who verbally acknowledged these results. Electronically Signed   By: Waddell Calk M.D.   On: 12/15/2023 12:51    ASSESSMENT & PLAN:   DVT, lower extremity, distal, acute, right (HCC) 12/15/2023- Positive for deep vein thrombosis involving the right popliteal, posterior tibial and peroneal veins. Currently on Eliquis . Recommend vascular surgery evaluation.   # Patient currently tolerating Eliquis  well-with improvement of his leg cramping.  Agree with anticoagulation-to help prevent further blood clots at this time.  Refilled Eliquis - PCP.   # Patient blood clot is thought to be unprovoked given the absence of any identifiable reasons. 3-6 months anticoagulation recommended-however the duration of the anticoagulation will depend upon further hypercoagulable workup.  Also await vascular evaluation.  Consider repeat ultrasound down the line.  # Recommend graduated Compression stockings 30 [at mid calf]-40 mm [at ankle]. Wear them during daytime.  Take them off at night; and keep the legs propped up in bed.   # I had a long discussion with the patient regarding the risk of bleeding while on anticoagulation.  Patient counseled regarding taking at most care to avoid falls/hitting head. The patient should inform us  of any bleeding episodes; or report to emergency room if any significant bleeding is noticed.  Thank you Dr. Tawni for allowing me to participate in the care of your pleasant patient. Please do not hesitate to contact me with questions or concerns in the interim.   # DISPOSITION: # refer to vascular surgery re: RIGHT LE DVT # Labs today-ordered # follow up in 3-4 weeks-  MD: NO labs- Dr.B  All questions were answered. The patient knows to call the  clinic with any problems, questions or concerns.     Cindy JONELLE Joe, MD 01/01/2024 12:09 PM

## 2024-01-01 NOTE — Progress Notes (Signed)
 Pt concerned with heart failure due to having the DVT. Would like to discuss this concern.

## 2024-01-02 LAB — BETA-2-GLYCOPROTEIN I ABS, IGG/M/A
Beta-2 Glyco I IgG: <9 GPI IgG units (ref 0–20)
Beta-2-Glycoprotein I IgA: <9 GPI IgA units (ref 0–25)
Beta-2-Glycoprotein I IgM: <9 GPI IgM units (ref 0–32)

## 2024-01-03 LAB — ANTIPHOSPHOLIPID SYNDROME PROF
Anticardiolipin IgG: <9 GPL U/mL (ref 0–14)
Anticardiolipin IgM: 12 [MPL'U]/mL (ref 0–12)
DRVVT: 69.4 s — ABNORMAL HIGH (ref 0.0–47.0)
PTT Lupus Anticoagulant: 34.4 s (ref 0.0–43.5)

## 2024-01-03 LAB — PROTEIN C ACTIVITY: Protein C Activity: 93 % (ref 73–180)

## 2024-01-03 LAB — DRVVT MIX: dRVVT Mix: 47 s — ABNORMAL HIGH (ref 0.0–40.4)

## 2024-01-03 LAB — PROTEIN S ACTIVITY: Protein S Activity: 86 % (ref 63–140)

## 2024-01-03 LAB — DRVVT CONFIRM: dRVVT Confirm: 1.1 ratio (ref 0.8–1.2)

## 2024-01-04 LAB — FACTOR 5 LEIDEN

## 2024-01-08 LAB — PROTHROMBIN GENE MUTATION

## 2024-01-28 ENCOUNTER — Encounter (INDEPENDENT_AMBULATORY_CARE_PROVIDER_SITE_OTHER): Payer: Self-pay | Admitting: Nurse Practitioner

## 2024-01-28 ENCOUNTER — Encounter (INDEPENDENT_AMBULATORY_CARE_PROVIDER_SITE_OTHER): Payer: Self-pay | Admitting: Vascular Surgery

## 2024-01-28 ENCOUNTER — Ambulatory Visit (INDEPENDENT_AMBULATORY_CARE_PROVIDER_SITE_OTHER): Payer: Self-pay | Admitting: Vascular Surgery

## 2024-01-28 VITALS — BP 131/85 | HR 51 | Ht 73.0 in | Wt 178.4 lb

## 2024-01-28 DIAGNOSIS — I824Y9 Acute embolism and thrombosis of unspecified deep veins of unspecified proximal lower extremity: Secondary | ICD-10-CM

## 2024-01-29 ENCOUNTER — Encounter (INDEPENDENT_AMBULATORY_CARE_PROVIDER_SITE_OTHER): Payer: Self-pay | Admitting: Vascular Surgery

## 2024-01-30 ENCOUNTER — Inpatient Hospital Stay: Attending: Internal Medicine | Admitting: Internal Medicine

## 2024-01-30 ENCOUNTER — Encounter: Payer: Self-pay | Admitting: Internal Medicine

## 2024-01-30 DIAGNOSIS — Z87891 Personal history of nicotine dependence: Secondary | ICD-10-CM | POA: Insufficient documentation

## 2024-01-30 DIAGNOSIS — I82441 Acute embolism and thrombosis of right tibial vein: Secondary | ICD-10-CM | POA: Insufficient documentation

## 2024-01-30 DIAGNOSIS — I824Z1 Acute embolism and thrombosis of unspecified deep veins of right distal lower extremity: Secondary | ICD-10-CM | POA: Diagnosis not present

## 2024-01-30 DIAGNOSIS — Z7901 Long term (current) use of anticoagulants: Secondary | ICD-10-CM | POA: Insufficient documentation

## 2024-01-30 DIAGNOSIS — Z86718 Personal history of other venous thrombosis and embolism: Secondary | ICD-10-CM | POA: Insufficient documentation

## 2024-01-30 NOTE — Progress Notes (Signed)
 Pt states he saw Dr. Jama yesterday. He states they went over labs but he has questions and was told to ask you.

## 2024-01-30 NOTE — Progress Notes (Signed)
 [facility] CONSULT NOTE  Patient Care Team: Diedra Lame, MD as PCP - General (Family Medicine) Rennie Alan SAUNDERS, MD as Consulting Physician (Oncology)  CHIEF COMPLAINTS/PURPOSE OF CONSULTATION: DVT  acute  Right LE.   #  Oncology History   No history exists.     HISTORY OF PRESENTING ILLNESS: Patient ambulating-independently. Alone.   Alan Arroyo 78 y.o.  male with acute Right lower extremity  DVT on eliquis  is here for follow-up.  Pt states he saw Dr. Jama yesterday. Notes to have improvement of his leg swelling.   Review of Systems  Constitutional:  Negative for chills, diaphoresis, fever, malaise/fatigue and weight loss.  HENT:  Negative for nosebleeds and sore throat.   Eyes:  Negative for double vision.  Respiratory:  Negative for cough, hemoptysis, sputum production, shortness of breath and wheezing.   Cardiovascular:  Negative for chest pain, palpitations, orthopnea and leg swelling.  Gastrointestinal:  Negative for abdominal pain, blood in stool, constipation, diarrhea, heartburn, melena, nausea and vomiting.  Genitourinary:  Negative for dysuria, frequency and urgency.  Musculoskeletal:  Negative for back pain and joint pain.  Skin: Negative.  Negative for itching and rash.  Neurological:  Negative for dizziness, tingling, focal weakness, weakness and headaches.  Endo/Heme/Allergies:  Does not bruise/bleed easily.  Psychiatric/Behavioral:  Negative for depression. The patient is not nervous/anxious and does not have insomnia.      MEDICAL HISTORY:  History reviewed. No pertinent past medical history.  SURGICAL HISTORY: Past Surgical History:  Procedure Laterality Date   TONSILLECTOMY      SOCIAL HISTORY: Social History   Socioeconomic History   Marital status: Married    Spouse name: Not on file   Number of children: Not on file   Years of education: Not on file   Highest education level: Not on file  Occupational History    Not on file  Tobacco Use   Smoking status: Former   Smokeless tobacco: Never  Vaping Use   Vaping status: Never Used  Substance and Sexual Activity   Alcohol use: Yes    Comment: occas.    Drug use: Never   Sexual activity: Not on file  Other Topics Concern   Not on file  Social History Narrative   Not on file   Social Drivers of Health   Financial Resource Strain: Patient Declined (05/17/2023)   Received from [facility]   Overall Financial Resource Strain (CARDIA)    Difficulty of Paying Living Expenses: Patient declined  Food Insecurity: No Food Insecurity (01/01/2024)   Hunger Vital Sign    Worried About Running Out of Food in the Last Year: Never true    Ran Out of Food in the Last Year: Never true  Transportation Needs: No Transportation Needs (01/01/2024)   PRAPARE - Administrator, Civil Service (Medical): No    Lack of Transportation (Non-Medical): No  Physical Activity: Not on file  Stress: Not on file  Social Connections: Not on file  Intimate Partner Violence: Not At Risk (01/01/2024)   Humiliation, Afraid, Rape, and Kick questionnaire    Fear of Current or Ex-Partner: No    Emotionally Abused: No    Physically Abused: No    Sexually Abused: No    FAMILY HISTORY: Family History  Problem Relation Age of Onset   Prostate cancer Brother     ALLERGIES:  is allergic to fish allergy.  MEDICATIONS:  Current Outpatient Medications  Medication Sig Dispense  Refill   APIXABAN  (ELIQUIS ) VTE STARTER PACK (10MG  AND 5MG ) Take as directed on package: start with two-5mg  tablets twice daily for 7 days. On day 8, switch to one-5mg  tablet twice daily. 74 each 0   Multiple Vitamin (MULTI VITAMIN PO) Take 1 tablet by mouth. 3 times a week     No current facility-administered medications for this visit.       PHYSICAL EXAMINATION:  Vitals:   01/30/24 1247  BP: 133/86  Pulse: (!) 52  Resp: 16  Temp: (!) 95.7 F (35.4 C)  SpO2: 97%    Filed Weights   01/30/24 1247  Weight: 178 lb 6.4 oz (80.9 kg)    Physical Exam Vitals and nursing note reviewed.  HENT:     Head: Normocephalic and atraumatic.     Mouth/Throat:     Pharynx: Oropharynx is clear.  Eyes:     Extraocular Movements: Extraocular movements intact.     Pupils: Pupils are equal, round, and reactive to light.  Cardiovascular:     Rate and Rhythm: Normal rate and regular rhythm.  Pulmonary:     Comments: Decreased breath sounds bilaterally.  Abdominal:     Palpations: Abdomen is soft.  Musculoskeletal:        General: Normal range of motion.     Cervical back: Normal range of motion.  Skin:    General: Skin is warm.  Neurological:     General: No focal deficit present.     Mental Status: He is alert and oriented to person, place, and time.  Psychiatric:        Behavior: Behavior normal.        Judgment: Judgment normal.      LABORATORY DATA:  I have reviewed the data as listed Lab Results  Component Value Date   WBC 8.3 01/01/2024   HGB 15.4 01/01/2024   HCT 46.9 01/01/2024   MCV 93.4 01/01/2024   PLT 259 01/01/2024   Recent Labs    12/15/23 1249 01/01/24 1201  NA 137 137  K 4.4 4.4  CL 104 105  CO2 28 24  GLUCOSE 99 92  BUN 21 26*  CREATININE 1.18 1.26*  CALCIUM 8.7* 8.9  GFRNONAA >60 58*  PROT  --  7.2  ALBUMIN  --  4.1  AST  --  20  ALT  --  16  ALKPHOS  --  65  BILITOT  --  0.8    RADIOGRAPHIC STUDIES: I have personally reviewed the radiological images as listed and agreed with the findings in the report. No results found.   ASSESSMENT & PLAN:   DVT, lower extremity, distal, acute, right (HCC) # 12/15/2023- Positive for deep vein thrombosis involving the right popliteal, posterior tibial and peroneal veins. Currently on Eliquis . S/p Vascular surgery evaluation [Dr.Snheir]. hypercoagulable work up- NEGATIVE.   # Patient currently tolerating Eliquis  well-with improvement of his leg cramping.  Agree with  anticoagulation-to help prevent further blood clots at this time.  Refilled Eliquis  by PCP.   # Patient blood clot is thought to be unprovoked given the absence of any identifiable reasons- recommend a total of 6 months anticoagulation [finish dec 2025]. Currently awaiting re-evaluation with vascular in nov 2025. Continue compression stockingas  per vascular. Await repeat US  with vascular.   # DISPOSITION: # follow up in 6 months- MD; no labs- Dr.B      Alan JONELLE Joe, MD 01/30/2024 1:34 PM

## 2024-01-30 NOTE — Assessment & Plan Note (Addendum)
#   12/15/2023- Positive for deep vein thrombosis involving the right popliteal, posterior tibial and peroneal veins. Currently on Eliquis . S/p Vascular surgery evaluation [Dr.Snheir]. hypercoagulable work up- NEGATIVE.   # Patient currently tolerating Eliquis  well-with improvement of his leg cramping.  Agree with anticoagulation-to help prevent further blood clots at this time.  Refilled Eliquis  by PCP.   # Patient blood clot is thought to be unprovoked given the absence of any identifiable reasons- recommend a total of 6 months anticoagulation [finish dec 2025]. Currently awaiting re-evaluation with vascular in nov 2025. Continue compression stockingas  per vascular. Await repeat US  with vascular.   # DISPOSITION: # follow up in 6 months- MD; no labs- Dr.B

## 2024-02-05 ENCOUNTER — Encounter: Payer: Self-pay | Admitting: *Deleted

## 2024-05-12 ENCOUNTER — Ambulatory Visit (INDEPENDENT_AMBULATORY_CARE_PROVIDER_SITE_OTHER): Admitting: Vascular Surgery

## 2024-05-12 ENCOUNTER — Ambulatory Visit (INDEPENDENT_AMBULATORY_CARE_PROVIDER_SITE_OTHER)

## 2024-05-12 ENCOUNTER — Encounter (INDEPENDENT_AMBULATORY_CARE_PROVIDER_SITE_OTHER): Payer: Self-pay | Admitting: Vascular Surgery

## 2024-05-12 VITALS — BP 142/85 | HR 47 | Resp 18 | Ht 72.0 in | Wt 178.6 lb

## 2024-05-12 DIAGNOSIS — I824Z1 Acute embolism and thrombosis of unspecified deep veins of right distal lower extremity: Secondary | ICD-10-CM | POA: Diagnosis not present

## 2024-05-12 DIAGNOSIS — I824Y9 Acute embolism and thrombosis of unspecified deep veins of unspecified proximal lower extremity: Secondary | ICD-10-CM

## 2024-05-12 MED ORDER — APIXABAN 5 MG PO TABS: 5.0000 mg | ORAL_TABLET | Freq: Two times a day (BID) | ORAL | 0 refills | Status: DC

## 2024-05-12 MED ORDER — APIXABAN (ELIQUIS) VTE STARTER PACK (10MG AND 5MG): ORAL_TABLET | ORAL | 0 refills | Status: DC

## 2024-05-12 NOTE — Progress Notes (Signed)
 MRN : [id number]  Alan Arroyo is a 78 y.o. (09-13-1945) male who presents with chief complaint of legs hurt and swell.  History of Present Illness:   The patient presents to the office for evaluation of DVT.  DVT was identified at [facility] by Duplex ultrasound dated 12/15/2023.     The initial symptoms were pain and swelling in the right lower extremity.   He notes that his right leg is essentially symptom-free.  It does not appear to be significantly more swollen compared to his left.  He does point out an area of stasis dermatitis and some persistent tenderness in the distribution of the small saphenous vein.  The patient has not been using compression therapy at this point.   No SOB or pleuritic chest pains.  No cough or hemoptysis.   No blood per rectum or blood in any sputum.  No excessive bruising per the patient.    No recent shortening of the patient's walking distance or new symptoms consistent with claudication.  No history of rest pain symptoms. No new ulcers or wounds of the lower extremities have occurred.   The patient denies amaurosis fugax or recent TIA symptoms. There are no recent neurological changes noted. No recent episodes of angina or shortness of breath documented.   Duplex ultrasound of the right lower extremity venous system dated December 15, 2023 demonstrates DVT in the posterior tibial peroneal gastrocnemius and distal popliteal veins  Duplex ultrasound of the right lower extremity venous system done today shows resolution of all thrombus within the deep venous system.  There is a small amount of thickening noted in the small saphenous vein but there is no significant chronic changes noted in the small saphenous vein.  No outpatient medications have been marked as taking for the 05/12/24 encounter (Appointment) with Jama, Cordella MATSU, MD.    No past medical history on file.  Past Surgical History:  Procedure Laterality Date   TONSILLECTOMY      Social  History Social History   Tobacco Use   Smoking status: Former   Smokeless tobacco: Never  Vaping Use   Vaping status: Never Used  Substance Use Topics   Alcohol use: Yes    Comment: occas.    Drug use: Never    Family History Family History  Problem Relation Age of Onset   Prostate cancer Brother     Allergies  Allergen Reactions   Fish Allergy     Fresh water fish     REVIEW OF SYSTEMS (Negative unless checked)  Constitutional: [] Weight loss  [] Fever  [] Chills Cardiac: [] Chest pain   [] Chest pressure   [] Palpitations   [] Shortness of breath when laying flat   [] Shortness of breath with exertion. Vascular:  [] Pain in legs with walking   [x] Pain in legs at rest  [] History of DVT   [] Phlebitis   [x] Swelling in legs   [] Varicose veins   [] Non-healing ulcers Pulmonary:   [] Uses home oxygen   [] Productive cough   [] Hemoptysis   [] Wheeze  [] COPD   [] Asthma Neurologic:  [] Dizziness   [] Seizures   [] History of stroke   [] History of TIA  [] Aphasia   [] Vissual changes   [] Weakness or numbness in arm   [] Weakness or numbness in leg Musculoskeletal:   [] Joint swelling   [] Joint pain   [] Low back pain Hematologic:  [] Easy bruising  [] Easy bleeding   [] Hypercoagulable state   [] Anemic Gastrointestinal:  [] Diarrhea   [] Vomiting  []   Gastroesophageal reflux/heartburn   [] Difficulty swallowing. Genitourinary:  [] Chronic kidney disease   [] Difficult urination  [] Frequent urination   [] Blood in urine Skin:  [] Rashes   [] Ulcers  Psychological:  [] History of anxiety   []  History of major depression.  Physical Examination  There were no vitals filed for this visit. There is no height or weight on file to calculate BMI. Gen: WD/WN, NAD Head: [place]/AT, No temporalis wasting.  Ear/Nose/Throat: Hearing grossly intact, nares w/o erythema or drainage, pinna without lesions Eyes: PER, EOMI, sclera nonicteric.  Neck: Supple, no gross masses.  No JVD.  Pulmonary:  Good air movement, no audible  wheezing, no use of accessory muscles.  Cardiac: RRR, precordium not hyperdynamic. Vascular:  scattered varicosities present bilaterally.  Moderate venous stasis changes to the legs bilaterally.  2+ soft pitting edema. CEAP [id number]   Vessel Right Left  Radial Palpable Palpable  Gastrointestinal: soft, non-distended. No guarding/no peritoneal signs.  Musculoskeletal: M/S 5/5 throughout.  No deformity.  Neurologic: CN 2-12 intact. Pain and light touch intact in extremities.  Symmetrical.  Speech is fluent. Motor exam as listed above. Psychiatric: Judgment intact, Mood & affect appropriate for pt's clinical situation. Dermatologic: Venous rashes no ulcers noted.  No changes consistent with cellulitis. Lymph : No lichenification or skin changes of chronic lymphedema.  CBC Lab Results  Component Value Date   WBC 8.3 01/01/2024   HGB 15.4 01/01/2024   HCT 46.9 01/01/2024   MCV 93.4 01/01/2024   PLT 259 01/01/2024    BMET    Component Value Date/Time   NA 137 01/01/2024 1201   K 4.4 01/01/2024 1201   CL 105 01/01/2024 1201   CO2 24 01/01/2024 1201   GLUCOSE 92 01/01/2024 1201   BUN 26 (H) 01/01/2024 1201   CREATININE 1.26 (H) 01/01/2024 1201   CALCIUM 8.9 01/01/2024 1201   GFRNONAA 58 (L) 01/01/2024 1201   GFRAA >60 10/24/2017 1159   CrCl cannot be calculated (Patient's most recent lab result is older than the maximum 21 days allowed.).  COAG No results found for: INR, PROTIME  Radiology No results found.   Assessment/Plan 1. DVT, lower extremity, distal, acute, right (HCC) (Primary) Recommend:   No surgery or intervention at this point in time.  IVC filter is not indicated at present.  Patient's duplex ultrasound of the venous system shows resolution of the DVT without chronic changes of the deep veins.  Minor thickening is noted in the small saphenous  The patient will complete his Eliquis  therapy with 1 more refill and then will discontinue Eliquis  as we  discussed.  I did review the possibility of Eliquis  2.5 mg twice daily but I do not see a compelling indication.  Elevation was stressed, such as the use of a recliner.  I have reviewed with the patient DVT and post phlebitic changes such as swelling and why it  causes symptoms such as pain.  I recommended to the patient to wear graduated compression stockings, beginning after three full days of anticoagulation.  Graduated compression should be worn on a daily basis. The patient should wear compression beginning first thing in the morning and removing them in the evening. The patient is instructed specifically not to sleep in the stockings.  In addition, behavioral modification including elevation during the day and avoidance of prolonged dependency will be initiated.    The patient will stop anticoagulation after this next refill as there have not been any problems or complications at this point.  I did  discuss half dose therapy, however at this time the patient wishes to just stop anticoagulation.    Cordella Shawl, MD  05/12/2024 8:59 AM

## 2024-06-13 ENCOUNTER — Telehealth: Payer: Self-pay | Admitting: Internal Medicine

## 2024-06-13 ENCOUNTER — Other Ambulatory Visit: Payer: Self-pay

## 2024-06-13 DIAGNOSIS — I824Z1 Acute embolism and thrombosis of unspecified deep veins of right distal lower extremity: Secondary | ICD-10-CM

## 2024-06-13 NOTE — Telephone Encounter (Signed)
 Per Dr. KATHEE secure chat: michelle/Anaiya Wisinski-please order D-dimer levels-early next week. And then-Will talk to his PCP based upon the results- and will plan.

## 2024-06-13 NOTE — Telephone Encounter (Signed)
 Lab ordered.

## 2024-06-13 NOTE — Telephone Encounter (Signed)
 Patient is here and asking if he needs to stay on Eliquis . He stated he had a conversation with Dr. B the other day and had a conversation and Dr. B was going to talk to his PCP about this. Please advise.

## 2024-06-13 NOTE — Telephone Encounter (Signed)
 Pt notified by Heron BROCKS.

## 2024-06-16 ENCOUNTER — Inpatient Hospital Stay: Attending: Internal Medicine

## 2024-06-16 DIAGNOSIS — I824Z1 Acute embolism and thrombosis of unspecified deep veins of right distal lower extremity: Secondary | ICD-10-CM

## 2024-06-16 DIAGNOSIS — Z86718 Personal history of other venous thrombosis and embolism: Secondary | ICD-10-CM | POA: Insufficient documentation

## 2024-06-16 LAB — D-DIMER, QUANTITATIVE: D-Dimer, Quant: <0.27 ug{FEU}/mL (ref 0.00–0.50)

## 2024-06-16 NOTE — Telephone Encounter (Signed)
 Results are in

## 2024-06-24 ENCOUNTER — Other Ambulatory Visit: Payer: Self-pay | Admitting: *Deleted

## 2024-06-24 ENCOUNTER — Telehealth: Payer: Self-pay | Admitting: *Deleted

## 2024-06-24 DIAGNOSIS — I824Z1 Acute embolism and thrombosis of unspecified deep veins of right distal lower extremity: Secondary | ICD-10-CM

## 2024-06-24 NOTE — Telephone Encounter (Signed)
 Spoke patient regarding the discussion with Dr. Dulce of-okay to come off the anticoagulation at this time-December 16th, 2025.  Recommend checking D-dimer level-1 to 2 days prior to next visit.  Please schedule  GB

## 2024-06-24 NOTE — Telephone Encounter (Signed)
 Patient called back again x 2 this morning. He wants a call back to determine if he needs to stay on Eliquis  and if Dr. Corky has spoke with vascular provider and Dr. Tawni. I reassured him that I sent the message to the team. We will get with Dr. Rennie and call him back with further recommendations.

## 2024-06-24 NOTE — Telephone Encounter (Signed)
 Patient left a vm last evening. Would like to know if Dr. Rennie has spoken to Dr. Tawni regarding his recent d-dimer results. He has not heard anything back from our office.

## 2024-07-30 ENCOUNTER — Inpatient Hospital Stay: Attending: Internal Medicine

## 2024-07-30 DIAGNOSIS — I824Z1 Acute embolism and thrombosis of unspecified deep veins of right distal lower extremity: Secondary | ICD-10-CM

## 2024-07-30 LAB — D-DIMER, QUANTITATIVE: D-Dimer, Quant: <0.27 ug{FEU}/mL (ref 0.00–0.50)

## 2024-08-01 ENCOUNTER — Inpatient Hospital Stay: Admitting: Internal Medicine

## 2024-08-01 ENCOUNTER — Encounter: Payer: Self-pay | Admitting: Internal Medicine

## 2024-08-01 DIAGNOSIS — I824Z1 Acute embolism and thrombosis of unspecified deep veins of right distal lower extremity: Secondary | ICD-10-CM | POA: Diagnosis not present

## 2024-08-01 NOTE — Progress Notes (Signed)
 [facility] CONSULT NOTE  Patient Care Team: Diedra Lame, MD as PCP - General (Family Medicine) Rennie Cindy SAUNDERS, MD as Consulting Physician (Oncology)  CHIEF COMPLAINTS/PURPOSE OF CONSULTATION: DVT  acute  Right LE.   #  Oncology History   No problem history exists.     HISTORY OF PRESENTING ILLNESS: Patient ambulating-independently. Alone.   Alan Arroyo 79 y.o.  male with acute Right lower extremity  DVT s/p eliquis  is here for follow-up.  Discussed the use of AI scribe software for clinical note transcription with the patient, who gave verbal consent to proceed.  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  He was diagnosed with an unprovoked deep vein thrombosis in the right popliteal region above the knee and completed a six-month course of Eliquis , initiated in June and discontinued in December. He has not experienced recurrence of thrombotic events or new clots since cessation of anticoagulation. The etiology of the initial thrombus remains unclear.  Recent evaluation included a D-dimer test and right lower extremity ultrasound.  Over the past six months, he has noted decreased stamina and strength despite regular gym attendance. He sustained a shoulder injury during exercise, resulting in persistent throbbing pain around the shoulder joint with radiation toward the sternum. He questioned whether anticoagulation contributed to these symptoms, but no other unusual factors were identified. He denies chest pain, shortness of breath, or other cardiovascular symptoms.       Review of Systems  Constitutional:  Negative for chills, diaphoresis, fever, malaise/fatigue and weight loss.  HENT:  Negative for nosebleeds and sore throat.   Eyes:  Negative for double vision.  Respiratory:  Negative for cough,  hemoptysis, sputum production, shortness of breath and wheezing.   Cardiovascular:  Negative for chest pain, palpitations, orthopnea and leg swelling.  Gastrointestinal:  Negative for abdominal pain, blood in stool, constipation, diarrhea, heartburn, melena, nausea and vomiting.  Genitourinary:  Negative for dysuria, frequency and urgency.  Musculoskeletal:  Negative for back pain and joint pain.  Skin: Negative.  Negative for itching and rash.  Neurological:  Negative for dizziness, tingling, focal weakness, weakness and headaches.  Endo/Heme/Allergies:  Does not bruise/bleed easily.  Psychiatric/Behavioral:  Negative for depression. The patient is not nervous/anxious and does not have insomnia.      MEDICAL HISTORY:  History reviewed. No pertinent past medical history.  SURGICAL HISTORY: Past Surgical History:  Procedure Laterality Date   TONSILLECTOMY      SOCIAL HISTORY: Social History   Socioeconomic History   Marital status: Married    Spouse name: Not on file   Number of children: Not on file   Years of education: Not on file   Highest education level: Not on file  Occupational History   Not on file  Tobacco Use   Smoking status: Former   Smokeless tobacco: Never  Vaping Use   Vaping status: Never Used  Substance and Sexual Activity   Alcohol use: Yes    Comment: occas.    Drug use: Never   Sexual activity: Not on file  Other Topics Concern   Not on file  Social History Narrative   Not on file   Social Drivers of Health   Tobacco Use: Medium Risk (08/01/2024)   Patient History    Smoking Tobacco Use: Former    Smokeless Tobacco Use: Never  Passive Exposure: Not on file  Financial Resource Strain: Low Risk  (05/19/2024)   Received from [facility]   Overall Financial Resource Strain (CARDIA)    Difficulty of Paying Living Expenses: Not hard at all  Food Insecurity: No Food Insecurity (05/19/2024)   Received from [facility]   Epic    Within the past 12 months, you worried that your food would run out before you got the money to buy more.: Never true    Within the past 12 months, the food you bought just didn't last and you didn't have money to get more.: Never true  Transportation Needs: No Transportation Needs (05/19/2024)   Received from [facility] - Transportation    In the past 12 months, has lack of transportation kept you from medical appointments or from getting medications?: No    Lack of Transportation (Non-Medical): No  Physical Activity: Not on file  Stress: Not on file  Social Connections: Not on file  Intimate Partner Violence: Not At Risk (01/01/2024)   Epic    Fear of Current or Ex-Partner: No    Emotionally Abused: No    Physically Abused: No    Sexually Abused: No  Depression (PHQ2-9): Low Risk (08/01/2024)   Depression (PHQ2-9)    PHQ-2 Score: 0  Alcohol Screen: Not on file  Housing: Low Risk  (05/19/2024)   Received from [facility]   Epic    In the last 12 months, was there a time when you were not able to pay the mortgage or rent on time?: No    In the past 12 months, how many times have you moved where you were living?: 1    At any time in the past 12 months, were you homeless or living in a shelter (including now)?: No  Utilities: Not At Risk (05/19/2024)   Received from [facility]   Epic    In the past 12 months has the electric, gas, oil, or water company threatened to shut off services in your home?: No  Health Literacy: Not on file    FAMILY HISTORY: Family History  Problem Relation Age of Onset   Prostate cancer Brother     ALLERGIES:  is allergic to fish allergy.  MEDICATIONS:  Current Outpatient Medications  Medication Sig Dispense Refill   Multiple Vitamin (MULTI VITAMIN PO) Take 1 tablet by mouth. 3 times a week     No current facility-administered medications for this visit.        PHYSICAL EXAMINATION:  Vitals:   08/01/24 1303  BP: (!) 139/58  Pulse: (!) 49  Resp: 16  Temp: 98.5 F (36.9 C)  SpO2: 100%   Filed Weights   08/01/24 1303  Weight: 184 lb (83.5 kg)    Physical Exam Vitals and nursing note reviewed.  HENT:     Head: Normocephalic and atraumatic.     Mouth/Throat:     Pharynx: Oropharynx is clear.  Eyes:     Extraocular Movements: Extraocular movements intact.     Pupils: Pupils are equal, round, and reactive to light.  Cardiovascular:     Rate and Rhythm: Normal rate and regular rhythm.  Pulmonary:     Comments: Decreased breath sounds bilaterally.  Abdominal:     Palpations: Abdomen is soft.  Musculoskeletal:        General: Normal range of motion.     Cervical back: Normal range of  motion.  Skin:    General: Skin is warm.  Neurological:     General: No focal deficit present.     Mental Status: He is alert and oriented to person, place, and time.  Psychiatric:        Behavior: Behavior normal.        Judgment: Judgment normal.      LABORATORY DATA:  I have reviewed the data as listed Lab Results  Component Value Date   WBC 8.3 01/01/2024   HGB 15.4 01/01/2024   HCT 46.9 01/01/2024   MCV 93.4 01/01/2024   PLT 259 01/01/2024   Recent Labs    12/15/23 1249 01/01/24 1201  NA 137 137  K 4.4 4.4  CL 104 105  CO2 28 24  GLUCOSE 99 92  BUN 21 26*  CREATININE 1.18 1.26*  CALCIUM 8.7* 8.9  GFRNONAA >60 58*  PROT  --  7.2  ALBUMIN  --  4.1  AST  --  20  ALT  --  16  ALKPHOS  --  65  BILITOT  --  0.8    RADIOGRAPHIC STUDIES: I have personally reviewed the radiological images as listed and agreed with the findings in the report. No results found.   ASSESSMENT & PLAN:   DVT, lower extremity, distal, acute, right (HCC) # 12/15/2023- Positive for deep vein thrombosis involving the right popliteal, posterior tibial and peroneal veins. Currently on Eliquis . S/p Vascular surgery evaluation [Dr.Snheir].  hypercoagulable work up- NEGATIVE.   S/P eliquis  x 6 months- stopped dec 2025; Per vascular NOV 2025-  Duplex ultrasound of the right lower extremity venous system done today shows resolution of all thrombus within the deep venous system. There is a small amount of thickening noted in the small saphenous vein but there is no significant chronic changes noted in the small saphenous vein . JAN 2026 [off anticoagulation]- D-Dimer- NEG.   # After multiple discussions back and forth, after reviewing the pro and cons of continued anticoagulation- it was decided pt will come off anticoagulation off at this time.   # DISPOSITION: # follow up as needed- Dr.B    Cindy JONELLE Joe, MD 08/01/2024 1:45 PM

## 2024-08-01 NOTE — Progress Notes (Signed)
"  No concerns at this time  "

## 2024-08-01 NOTE — Assessment & Plan Note (Addendum)
#   12/15/2023- Positive for deep vein thrombosis involving the right popliteal, posterior tibial and peroneal veins. Currently on Eliquis . S/p Vascular surgery evaluation [Dr.Snheir]. hypercoagulable work up- NEGATIVE.   S/P eliquis  x 6 months- stopped dec 2025; Per vascular NOV 2025-  Duplex ultrasound of the right lower extremity venous system done today shows resolution of all thrombus within the deep venous system. There is a small amount of thickening noted in the small saphenous vein but there is no significant chronic changes noted in the small saphenous vein . JAN 2026 [off anticoagulation]- D-Dimer- NEG.   # After multiple discussions back and forth, after reviewing the pro and cons of continued anticoagulation- it was decided pt will come off anticoagulation off at this time.   # DISPOSITION: # follow up as needed- Dr.B

## 2024-08-06 ENCOUNTER — Other Ambulatory Visit (INDEPENDENT_AMBULATORY_CARE_PROVIDER_SITE_OTHER): Payer: Self-pay | Admitting: Vascular Surgery

## 2024-08-08 ENCOUNTER — Other Ambulatory Visit: Payer: Self-pay | Admitting: Internal Medicine

## 2024-08-08 DIAGNOSIS — R972 Elevated prostate specific antigen [PSA]: Secondary | ICD-10-CM

## 2024-08-14 ENCOUNTER — Other Ambulatory Visit: Payer: Self-pay | Admitting: Internal Medicine

## 2024-08-14 DIAGNOSIS — R972 Elevated prostate specific antigen [PSA]: Secondary | ICD-10-CM

## 2024-08-22 ENCOUNTER — Ambulatory Visit

## 2024-08-27 ENCOUNTER — Other Ambulatory Visit
